# Patient Record
Sex: Female | Born: 1960 | Race: White | Hispanic: No | Marital: Married | Smoking: Never smoker
Health system: Southern US, Community
[De-identification: ages and names within clinical notes are randomized; demographics above are authoritative.]

## PROBLEM LIST (undated history)

## (undated) DIAGNOSIS — L309 Dermatitis, unspecified: Secondary | ICD-10-CM

## (undated) DIAGNOSIS — J069 Acute upper respiratory infection, unspecified: Secondary | ICD-10-CM

## (undated) DIAGNOSIS — K219 Gastro-esophageal reflux disease without esophagitis: Secondary | ICD-10-CM

## (undated) HISTORY — PX: CHOLECYSTECTOMY: SHX55

## (undated) HISTORY — DX: Acute upper respiratory infection, unspecified: J06.9

## (undated) HISTORY — PX: TYMPANOSTOMY TUBE PLACEMENT: SHX32

## (undated) HISTORY — DX: Dermatitis, unspecified: L30.9

## (undated) HISTORY — PX: ABDOMINAL HYSTERECTOMY: SHX81

---

## 1999-01-12 ENCOUNTER — Other Ambulatory Visit: Admission: RE | Admit: 1999-01-12 | Discharge: 1999-01-12 | Payer: Self-pay | Admitting: Obstetrics and Gynecology

## 1999-10-16 ENCOUNTER — Ambulatory Visit (HOSPITAL_COMMUNITY): Admission: RE | Admit: 1999-10-16 | Discharge: 1999-10-16 | Payer: Self-pay | Admitting: Gastroenterology

## 2002-01-31 ENCOUNTER — Other Ambulatory Visit: Admission: RE | Admit: 2002-01-31 | Discharge: 2002-01-31 | Payer: Self-pay | Admitting: Obstetrics and Gynecology

## 2004-02-03 ENCOUNTER — Encounter: Admission: RE | Admit: 2004-02-03 | Discharge: 2004-02-03 | Payer: Self-pay | Admitting: Internal Medicine

## 2004-03-26 ENCOUNTER — Other Ambulatory Visit: Admission: RE | Admit: 2004-03-26 | Discharge: 2004-03-26 | Payer: Self-pay | Admitting: Obstetrics and Gynecology

## 2005-04-22 ENCOUNTER — Other Ambulatory Visit: Admission: RE | Admit: 2005-04-22 | Discharge: 2005-04-22 | Payer: Self-pay | Admitting: Obstetrics and Gynecology

## 2006-02-02 ENCOUNTER — Encounter: Admission: RE | Admit: 2006-02-02 | Discharge: 2006-02-02 | Payer: Self-pay | Admitting: Internal Medicine

## 2006-02-04 ENCOUNTER — Inpatient Hospital Stay (HOSPITAL_COMMUNITY): Admission: EM | Admit: 2006-02-04 | Discharge: 2006-02-05 | Payer: Self-pay | Admitting: Emergency Medicine

## 2007-12-25 ENCOUNTER — Ambulatory Visit: Payer: Self-pay | Admitting: Professional

## 2008-01-01 ENCOUNTER — Ambulatory Visit: Payer: Self-pay | Admitting: Professional

## 2008-07-25 ENCOUNTER — Ambulatory Visit: Payer: Self-pay | Admitting: Professional

## 2008-09-06 ENCOUNTER — Encounter: Admission: RE | Admit: 2008-09-06 | Discharge: 2008-09-06 | Payer: Self-pay | Admitting: Sports Medicine

## 2008-09-12 ENCOUNTER — Encounter: Admission: RE | Admit: 2008-09-12 | Discharge: 2008-09-12 | Payer: Self-pay | Admitting: Sports Medicine

## 2008-10-03 ENCOUNTER — Encounter: Admission: RE | Admit: 2008-10-03 | Discharge: 2008-10-03 | Payer: Self-pay | Admitting: Sports Medicine

## 2008-11-07 ENCOUNTER — Encounter: Admission: RE | Admit: 2008-11-07 | Discharge: 2008-11-07 | Payer: Self-pay | Admitting: Sports Medicine

## 2010-06-12 NOTE — Discharge Summary (Signed)
NAMECOURTENY, EGLER                  ACCOUNT NO.:  [id number]   MEDICAL RECORD NO.:  [id number]          PATIENT TYPE:  INP   LOCATION:  6732                         FACILITY:  [facility]   PHYSICIAN:  Theressa Millard, M.D.    DATE OF BIRTH:  May 10, 1960   DATE OF ADMISSION:  02/03/2006  DATE OF DISCHARGE:  02/05/2006                               DISCHARGE SUMMARY   ADMITTING DIAGNOSIS:  Urticaria and facial swelling.   DISCHARGE DIAGNOSES:  1. Urticaria and angioedema, ? drug reaction versus contrast reaction      versus other.  2. Chest pain, possibly related to angioedema.  3. Adult attention deficit disorder.  4. Allergic rhinitis.   The patient is a 50 year old white female who a couple of days prior to  admission developed problems with itching.  On the morning before  admission, she developed chest pain and was seen in the office by Dr.  Olena Leatherwood.  She had an EKG and chest x-ray and ultimately a CT scan of the  chest which revealed no significant findings.  The pain was treated with  Demerol in the office.  Later that night, she developed problems with  itching of the palms and a rash that seemed to be consistent with hives.  She took Benadryl without much relief.  She came to the emergency room  at approximately 4 a.m. when she developed diffuse facial and scalp  swelling without redness or significant itching.   HOSPITAL COURSE:  The patient was admitted and she had a striking  swelling of the upper face, forehead, and scalp, as well as occiput.  This was consistent with more of an angioedema look.  There were no  urticarial lesions on the face or the scalp.  She did have urticarial  lesions on the arms and red macular lesions on the palms bilaterally.  These were itching.  She was seen in consultation by Dr. Venancio Poisson  from dermatology.  She was thought to probably be having a reaction to  something that she was exposed to.  Dr. Nicholas Lose raised the possibility  that this  possibly could be contrast induced.  The difficulty is that  she was having some mild symptoms even before she got contrast, so the  etiology of each of her symptoms is not very clear.  In any event, she  improved over the subsequent 24 hours with decreasing facial swelling,  although it was still present, and she was continued on Atarax during  this time.  She was not treated with systemic steroids after initial  shot.  She was given Toradol for the chest discomfort.  She was  discharged in improved condition on Toradol one q. 6h. p.r.n. pain,  Atarax 50 mg one q. 6h. p.r.n. itching, and Adderall 20 mg q. a.m. and  10 mg q. p.m.  She was advised that she may have a contrast reaction and  if she needs contrast material in the future she should be pretreated.   ACTIVITY:  As tolerated.   FOLLOWUP:  As needed.      Theressa Millard, M.D.  Electronically Signed    JO/MEDQ  D:  02/07/2006  T:  02/07/2006  Job:  [id number]

## 2010-06-12 NOTE — H&P (Signed)
Karina Alvarez, Karina Alvarez NO.:  [id number]   MEDICAL RECORD NO.:  [id number]          PATIENT TYPE:  EMS   LOCATION:  MAJO                         FACILITY:  [facility]   PHYSICIAN:  Theressa Millard, M.D.    DATE OF BIRTH:  Apr 19, 1960   DATE OF ADMISSION:  02/03/2006  DATE OF DISCHARGE:                              HISTORY & PHYSICAL   PRIMARY CARE PHYSICIAN:  Dr. Benjaman Kindler.   CHIEF COMPLAINT:  Swelling and itching.   HISTORY OF PRESENT ILLNESS:  The patient is a 50 year old white female  with past medical history of GERD, who recently for a right elbow  tenosynovitis was started on prednisone in the past week.  She said she  had been tolerating this, but then started noticing some pruritus  systemically starting about 3-4 days ago.  Those symptoms persisted and  then all of a sudden she started complaining of noticing some chest  pain.  She was evaluated by Dr. Newell Coral partner, Dr. Olena Leatherwood, in the  office, who had lab work and an EKG done of which were of no  consequence.  The patient was sent home.  Reportedly she got a dose of  pain medication believed to be Demerol in the office; she also got a  prescription of Demerol and went home.  She was going to fill this  prescription when she found an old bottle of Percocet pills that were  well over 6 months old, if not longer, and a neighbor had advised her  that she could take that for pain instead.  Please note that prior to  this, the patient had not taken any narcotics when her pruritus first  began.  She initially took 2 Percocet and then a few hours later had  taken 1 more.  She said that 1-2 hours after that, she noted that her  itching became markedly worse.  A rash, which initially had been on her  hands, started appearing as blotchy on her face and around her mouth and  she started noticing swelling of her head.  She was advised then to come  into the emergency room, which she did.  In the emergency room, she  was  noted to be slightly borderline hypotensive with a blood pressure of  96/66.  The patient was given 40 mg of Pepcid, 50 mg of hydroxyzine and  10 mg of p.o. dexamethasone.  She said initially the Pepcid and Atarax  seemed to work and she seemed to feel as if her symptoms were slightly  improving.  She felt as if, however, the itching was slightly increased  after she received the oral steroids.  Currently, she is complaining of  severe itching.  She denies any other complaints such as headache,  vision changes, dysphagia, chest pain, throat closing, palpitations,  shortness of breath, wheezing, coughing.  No abdominal pain.  No  hematuria or dysuria, constipation or diarrhea.  She does complain of  some mild swelling in her lower extremities and hands.   REVIEW OF SYSTEMS:  Otherwise negative.   PAST MEDICAL HISTORY:  1.  GERD.  2. Recent tenosynovitis.   MEDICATIONS:  She takes an allergy pill.  She was recently taking  prednisone acutely.   ALLERGIES:  She is allergic to CODEINE.  She notes no food allergies.   SOCIAL HISTORY:  She denies any tobacco, excessive alcohol or drug use.   FAMILY HISTORY:  Noncontributory.   PHYSICAL EXAMINATION:  VITALS ON ADMISSION:  Temperature 97.1, heart  rate 88, blood pressure 96/66, respirations 22, O2 saturation 99% on  room air.  GENERAL:  The patient is alert and oriented x3, in no apparent distress.  HEENT:  She is noted to have some marked cranial swelling, which is  impressive.  Her mucous membranes are slightly dry.  SKIN:  She has some trace blotchiness around her lips and sides and back  of her neck as well as trace blotches on her trunk and hands.  NECK:  She has no carotid bruits.  HEART:  Regular rate and rhythm.  S1 and S2.  LUNGS:  Completely clear to auscultation bilaterally.  ABDOMEN:  Soft, nontender and non-distended.  Positive bowel sounds.  EXTREMITIES:  No clubbing, cyanosis or pitting edema.  Pulses 2+.    LABORATORY WORK:  I have ordered a CBC with differential.   ASSESSMENT AND PLAN:  Systemic allergic reaction.  It certainly seems  consistent that this exacerbation was worsened after taking these old  Percocet pills, as evidenced by the cranial swelling, increased pruritus  and rash, which may be from the narcotic itself, or possibly from a  degraded product from old medications.  However, note that the pruritus  initially began on January 31, 2006 and the only new medicine recently  has been prednisone, which I find is extremely unusual to have a  allergic reaction to prednisone.  This may be a food allergy, although  the patient does not note any new types of food or exposure.  We will  plan to treat conservatively with intravenous fluids, as-needed Benadryl  and 24-hour observation.  If patient has significant improvement, would  not put her on a prednisone taper, given this possibility, although  again, albeit quite unusual.      Hollice Espy, M.D.   Electronically Signed     ______________________________  Theressa Millard, M.D.    SKK/MEDQ  D:  02/03/2006  T:  02/03/2006  Job:  [id number]

## 2010-06-12 NOTE — Procedures (Signed)
[facility]  Patient:    Karina Alvarez, Karina Alvarez                           MRN: [id number] Proc. Date: 10/16/99 Attending:  Verlin Grills, M.D. CC:         Juluis Mire, M.D.   Procedure Report  PROCEDURE:  Esophagogastroduodenoscopy and colonoscopy.  ENDOSCOPIST:  Verlin Grills, M.D.  REFERRING PHYSICIAN:  Juluis Mire, M.D.  INDICATIONS FOR PROCEDURE:  The patient is a 50 year old female.  Her brother was diagnosed with rectal cancer approximately two weeks ago.  Her brother is only 42 years old.  The patient also has chronic gastroesophageal reflux.  She request an upper endoscopy to rule out Barretts esophagus.  I discussed with the patient the complications associated with colonoscopy and esophagogastroduodenoscopy including intestinal bleeding and intestinal perforation.  The patient has signed the operative permit.  MEDICATION ALLERGIES:  CODEINE.  PAST MEDICAL HISTORY:  Allergic rhinitis, gastroesophageal reflux disease, remote cholecystectomy, remote vaginal hysterectomy, herpes labialis.  HABITS:  The patient does not smoke cigarettes and she consumes alcohol in moderation.  FAMILY HISTORY:  Brother diagnosed with rectal cancer at age 72.  Sister diagnosed with colon polyps which were removed colonoscopically.  PREMEDICATION:  Demerol 100 mg and Versed 7 mg.  ENDOSCOPE:  Olympus gastroscope and pediatric colonoscope.  DESCRIPTION OF PROCEDURE:  - Esophagogastroduodenoscopy - after obtaining informed consent, the patient was placed in the left lateral decubitus position.  I administered intravenous Demerol and intravenous Versed to achieve conscious sedation for the procedure.  The patients blood pressure and oxygen saturation and cardiac rhythm were monitored throughout the procedure and documented in the medical record.  The Olympus gastroscope was passed through the posterior hypopharynx into the proximal esophagus  without difficulty.  The hypopharynx, larynx, and vocal cords appeared normal.  Esophagoscopy:  The proximal, mid, and lower segments of the esophagus appeared normal.  Endoscopically, there is no evidence of mucosal esophageal scarring, Barretts esophagus, erosive esophagitis, or esophageal ulcers.  Gastroscopy:  Retroflexed view of the gastric cardia and fundus was normal. There is no evidence of a hiatal hernia.  Endoscopic appearance of the gastric body, antrum, and pylorus was normal.  Duodenoscopy:  The duodenal bulb and descending duodenum appeared normal.  ASSESSMENT:  Normal esophagogastroduodenoscopy.  #2 - PROCTOCOLONOSCOPY TO THE CECUM.  Anal inspection was normal.  Digital rectal exam was normal.  The Olympus pediatric video colonoscope was introduced into the rectum under direct vision and advanced to the cecum as identified by a normal-appearing ileocecal valve.  Colonic preparation for the exam today was excellent.  Rectum normal.  Sigmoid colon and descending colon normal.  Splenic flexure normal.  Transverse colon normal.  Hepatic flexure normal.  Ascending colon normal.  Cecum and ileocecal valve normal.  ASSESSMENT:  Normal proctocolonoscopy to the cecum.  RECOMMENDATIONS:  Repeat colonoscopy in five years. DD:  10/16/99 TD:  10/18/99 Job: [id number]

## 2010-07-08 ENCOUNTER — Other Ambulatory Visit: Payer: Self-pay | Admitting: Obstetrics and Gynecology

## 2010-07-08 DIAGNOSIS — R928 Other abnormal and inconclusive findings on diagnostic imaging of breast: Secondary | ICD-10-CM

## 2010-07-09 ENCOUNTER — Ambulatory Visit
Admission: RE | Admit: 2010-07-09 | Discharge: 2010-07-09 | Disposition: A | Discharge status: Home / Self Care | Payer: BC Managed Care – PPO | Source: Ambulatory Visit | Attending: Obstetrics and Gynecology | Admitting: Obstetrics and Gynecology

## 2010-07-09 DIAGNOSIS — R928 Other abnormal and inconclusive findings on diagnostic imaging of breast: Secondary | ICD-10-CM

## 2011-04-13 ENCOUNTER — Other Ambulatory Visit: Payer: Self-pay | Admitting: Obstetrics and Gynecology

## 2011-04-13 DIAGNOSIS — Z1231 Encounter for screening mammogram for malignant neoplasm of breast: Secondary | ICD-10-CM

## 2011-05-18 ENCOUNTER — Other Ambulatory Visit: Payer: Self-pay | Admitting: *Deleted

## 2011-05-18 ENCOUNTER — Ambulatory Visit
Admission: RE | Admit: 2011-05-18 | Discharge: 2011-05-18 | Disposition: A | Discharge status: Home / Self Care | Payer: BC Managed Care – PPO | Source: Ambulatory Visit | Attending: *Deleted | Admitting: *Deleted

## 2011-05-18 DIAGNOSIS — R221 Localized swelling, mass and lump, neck: Secondary | ICD-10-CM

## 2011-07-12 ENCOUNTER — Ambulatory Visit
Admission: RE | Admit: 2011-07-12 | Discharge: 2011-07-12 | Disposition: A | Discharge status: Home / Self Care | Payer: BC Managed Care – PPO | Source: Ambulatory Visit | Attending: Obstetrics and Gynecology | Admitting: Obstetrics and Gynecology

## 2011-07-12 DIAGNOSIS — Z1231 Encounter for screening mammogram for malignant neoplasm of breast: Secondary | ICD-10-CM

## 2011-09-22 ENCOUNTER — Other Ambulatory Visit: Payer: Self-pay | Admitting: Otolaryngology

## 2011-09-22 DIAGNOSIS — R42 Dizziness and giddiness: Secondary | ICD-10-CM

## 2011-09-23 ENCOUNTER — Ambulatory Visit
Admission: RE | Admit: 2011-09-23 | Discharge: 2011-09-23 | Disposition: A | Discharge status: Home / Self Care | Payer: BC Managed Care – PPO | Source: Ambulatory Visit | Attending: Otolaryngology | Admitting: Otolaryngology

## 2011-09-23 DIAGNOSIS — R42 Dizziness and giddiness: Secondary | ICD-10-CM

## 2011-10-26 ENCOUNTER — Other Ambulatory Visit: Payer: Self-pay | Admitting: Otolaryngology

## 2011-10-26 DIAGNOSIS — R42 Dizziness and giddiness: Secondary | ICD-10-CM

## 2011-11-01 ENCOUNTER — Ambulatory Visit
Admission: RE | Admit: 2011-11-01 | Discharge: 2011-11-01 | Disposition: A | Discharge status: Home / Self Care | Payer: BC Managed Care – PPO | Source: Ambulatory Visit | Attending: Otolaryngology | Admitting: Otolaryngology

## 2011-11-01 DIAGNOSIS — R42 Dizziness and giddiness: Secondary | ICD-10-CM

## 2011-11-01 MED ORDER — GADOBENATE DIMEGLUMINE 529 MG/ML IV SOLN
15.0000 mL | Freq: Once | INTRAVENOUS | Status: AC | PRN
  Administered 2011-11-01: 15 mL via INTRAVENOUS

## 2012-07-18 ENCOUNTER — Other Ambulatory Visit: Payer: Self-pay

## 2012-07-18 DIAGNOSIS — Z1231 Encounter for screening mammogram for malignant neoplasm of breast: Secondary | ICD-10-CM

## 2012-08-07 ENCOUNTER — Ambulatory Visit
Admission: RE | Admit: 2012-08-07 | Discharge: 2012-08-07 | Disposition: A | Discharge status: Home / Self Care | Payer: BC Managed Care – PPO | Source: Ambulatory Visit

## 2012-08-07 DIAGNOSIS — Z1231 Encounter for screening mammogram for malignant neoplasm of breast: Secondary | ICD-10-CM

## 2012-10-13 ENCOUNTER — Other Ambulatory Visit: Payer: Self-pay | Admitting: Nurse Practitioner

## 2012-10-13 DIAGNOSIS — R079 Chest pain, unspecified: Secondary | ICD-10-CM

## 2012-10-13 DIAGNOSIS — G8918 Other acute postprocedural pain: Secondary | ICD-10-CM

## 2012-10-16 ENCOUNTER — Ambulatory Visit
Admission: RE | Admit: 2012-10-16 | Discharge: 2012-10-16 | Disposition: A | Discharge status: Home / Self Care | Payer: 59 | Source: Ambulatory Visit | Attending: Nurse Practitioner | Admitting: Nurse Practitioner

## 2012-10-16 DIAGNOSIS — G8918 Other acute postprocedural pain: Secondary | ICD-10-CM

## 2012-10-16 DIAGNOSIS — R079 Chest pain, unspecified: Secondary | ICD-10-CM

## 2013-07-24 ENCOUNTER — Other Ambulatory Visit: Payer: Self-pay

## 2013-07-24 DIAGNOSIS — Z1231 Encounter for screening mammogram for malignant neoplasm of breast: Secondary | ICD-10-CM

## 2013-08-07 ENCOUNTER — Ambulatory Visit: Payer: BC Managed Care – PPO

## 2013-08-08 ENCOUNTER — Ambulatory Visit
Admission: RE | Admit: 2013-08-08 | Discharge: 2013-08-08 | Disposition: A | Discharge status: Home / Self Care | Payer: 59 | Source: Ambulatory Visit

## 2013-08-08 DIAGNOSIS — Z1231 Encounter for screening mammogram for malignant neoplasm of breast: Secondary | ICD-10-CM

## 2013-11-06 ENCOUNTER — Other Ambulatory Visit: Payer: Self-pay | Admitting: Gastroenterology

## 2013-11-06 ENCOUNTER — Ambulatory Visit
Admission: RE | Admit: 2013-11-06 | Discharge: 2013-11-06 | Disposition: A | Discharge status: Home / Self Care | Payer: 59 | Source: Ambulatory Visit | Attending: Gastroenterology | Admitting: Gastroenterology

## 2013-11-06 DIAGNOSIS — K5900 Constipation, unspecified: Secondary | ICD-10-CM

## 2014-03-28 ENCOUNTER — Telehealth: Payer: Self-pay | Admitting: *Deleted

## 2014-03-28 NOTE — Telephone Encounter (Addendum)
Pt request an antiinflammatory for the plantar fasciitis.  I informed pt, our office last saw her prior to 2015 and we would need to evaluate her prior to ordering any medications.

## 2014-07-15 ENCOUNTER — Other Ambulatory Visit: Payer: Self-pay

## 2014-07-15 DIAGNOSIS — Z1231 Encounter for screening mammogram for malignant neoplasm of breast: Secondary | ICD-10-CM

## 2014-08-12 ENCOUNTER — Ambulatory Visit
Admission: RE | Admit: 2014-08-12 | Discharge: 2014-08-12 | Disposition: A | Discharge status: Home / Self Care | Payer: PRIVATE HEALTH INSURANCE | Source: Ambulatory Visit

## 2014-08-12 DIAGNOSIS — Z1231 Encounter for screening mammogram for malignant neoplasm of breast: Secondary | ICD-10-CM

## 2015-08-07 ENCOUNTER — Other Ambulatory Visit: Payer: Self-pay | Admitting: Obstetrics and Gynecology

## 2015-08-07 DIAGNOSIS — Z1231 Encounter for screening mammogram for malignant neoplasm of breast: Secondary | ICD-10-CM

## 2015-08-18 ENCOUNTER — Ambulatory Visit
Admission: RE | Admit: 2015-08-18 | Discharge: 2015-08-18 | Disposition: A | Discharge status: Home / Self Care | Payer: PRIVATE HEALTH INSURANCE | Source: Ambulatory Visit | Attending: Obstetrics and Gynecology | Admitting: Obstetrics and Gynecology

## 2015-08-18 DIAGNOSIS — Z1231 Encounter for screening mammogram for malignant neoplasm of breast: Secondary | ICD-10-CM

## 2015-10-08 ENCOUNTER — Other Ambulatory Visit: Payer: Self-pay | Admitting: Obstetrics and Gynecology

## 2015-10-08 DIAGNOSIS — R7989 Other specified abnormal findings of blood chemistry: Secondary | ICD-10-CM

## 2015-10-08 DIAGNOSIS — R945 Abnormal results of liver function studies: Principal | ICD-10-CM

## 2015-10-10 ENCOUNTER — Ambulatory Visit
Admission: RE | Admit: 2015-10-10 | Discharge: 2015-10-10 | Disposition: A | Discharge status: Home / Self Care | Payer: No Typology Code available for payment source | Source: Ambulatory Visit | Attending: Obstetrics and Gynecology | Admitting: Obstetrics and Gynecology

## 2015-10-10 DIAGNOSIS — R7989 Other specified abnormal findings of blood chemistry: Secondary | ICD-10-CM

## 2015-10-10 DIAGNOSIS — R945 Abnormal results of liver function studies: Principal | ICD-10-CM

## 2015-10-15 ENCOUNTER — Other Ambulatory Visit: Payer: PRIVATE HEALTH INSURANCE

## 2015-11-20 ENCOUNTER — Encounter (HOSPITAL_COMMUNITY): Payer: Self-pay | Admitting: *Deleted

## 2015-11-20 ENCOUNTER — Emergency Department (HOSPITAL_COMMUNITY)
Admission: EM | Admit: 2015-11-20 | Discharge: 2015-11-21 | Disposition: A | Discharge status: Home / Self Care | Payer: No Typology Code available for payment source | Attending: Emergency Medicine | Admitting: Emergency Medicine

## 2015-11-20 DIAGNOSIS — R0989 Other specified symptoms and signs involving the circulatory and respiratory systems: Secondary | ICD-10-CM | POA: Insufficient documentation

## 2015-11-20 HISTORY — DX: Gastro-esophageal reflux disease without esophagitis: K21.9

## 2015-11-20 MED ORDER — GI COCKTAIL ~~LOC~~
30.0000 mL | Freq: Once | ORAL | Status: AC
  Administered 2015-11-20: 30 mL via ORAL
  Filled 2015-11-20: qty 30

## 2015-11-20 NOTE — ED Triage Notes (Signed)
Pt was eating fish when she got a small fishbone stuck in her throat.  Pt attempted to help it down by drinking water as well as continuing to eat.  Pt continues to feel this bone in her throat.  Pt denies any sob.  Pt was successfully able to eat and drink after feeling like the bone was stuck in her throat.  Pt reports that she is anxious.

## 2015-11-20 NOTE — ED Provider Notes (Signed)
[facility]-EMERGENCY DEPT Provider Note   CSN: [id number] Arrival date & time: 11/20/15  2143     History   Chief Complaint Chief Complaint  Patient presents with  . Foreign Body    fishbone stuck in throat    HPI BLAYRE PAPANIA is a 55 y.o. female.  KAIYLA STAHLY is a 55 y.o. Female who presents to the ED complaining of foreign body sensation in her throat. She reports she was eating fish tonight and she thinks with the first bite she felt a fishbone stuck in her throat. This occurred about 3 hours ago.  She reports she continued to eat her meal without difficulty. She's had no vomiting or nausea. She still complains of a foreign body sensation in her throat. She is unable to localize exactly where this sensation is. She reports she has had dilations in the past but she is not sure of the name of the gastroenterologist to do this. She takes medicines for acid reflux. She denies other symptoms. She denies fevers, nausea, vomiting, abdominal pain, or hematemesis.   The history is provided by the patient. No language interpreter was used.  Foreign Body  Associated symptoms include sore throat. Pertinent negatives include no fever, no congestion, no drooling, no trouble swallowing, no cough, no chest pain, no vomiting and no abdominal pain.    Past Medical History:  Diagnosis Date  . GERD (gastroesophageal reflux disease)     There are no active problems to display for this patient.   Past Surgical History:  Procedure Laterality Date  . ABDOMINAL HYSTERECTOMY    . CHOLECYSTECTOMY      OB History    No data available       Home Medications    Prior to Admission medications   Medication Sig Start Date End Date Taking? Authorizing Provider  DM-Phenylephrine-Acetaminophen (VICKS DAYQUIL COLD & FLU) 10-5-325 MG CAPS Take 2 capsules by mouth 2 (two) times daily as needed (COLD).   Yes Historical Provider, MD  guaiFENesin (MUCINEX) 600 MG 12 hr tablet Take 600 mg by mouth 2 (two)  times daily as needed for cough or to loosen phlegm.   Yes Historical Provider, MD  omeprazole (PRILOSEC OTC) 20 MG tablet Take 20 mg by mouth daily.   Yes Historical Provider, MD    Family History No family history on file.  Social History Social History  Substance Use Topics  . Smoking status: Never Smoker  . Smokeless tobacco: Never Used  . Alcohol use Yes     Allergies   Iohexol and Codeine   Review of Systems Review of Systems  Constitutional: Negative for fever.  HENT: Positive for sore throat. Negative for congestion, drooling, mouth sores and trouble swallowing.   Eyes: Negative for visual disturbance.  Respiratory: Negative for cough and shortness of breath.   Cardiovascular: Negative for chest pain.  Gastrointestinal: Negative for abdominal pain, blood in stool, diarrhea, nausea and vomiting.  Musculoskeletal: Negative for neck pain.  Skin: Negative for rash.  Neurological: Negative for headaches.     Physical Exam Updated Vital Signs BP 129/59 (BP Location: Left Arm)   Pulse 70   Temp 98.1 F (36.7 C) (Oral)   Resp 18   Ht 5\' 2"  (1.575 m)   Wt 85.3 kg   SpO2 100%   BMI 34.41 kg/m   Physical Exam  Constitutional: She appears well-developed and well-nourished. No distress.  Nontoxic appearing.  HENT:  Head: Normocephalic and atraumatic.  Right Ear: External ear  normal.  Left Ear: External ear normal.  Mouth/Throat: Oropharynx is clear and moist.  Throat is clear. Uvula is midline without edema. No drooling. No trismus. No stridor. Speech is clear and coherent.  Eyes: Conjunctivae are normal. Pupils are equal, round, and reactive to light. Right eye exhibits no discharge. Left eye exhibits no discharge.  Neck: Normal range of motion. Neck supple. No JVD present. No tracheal deviation present.  Cardiovascular: Normal rate, regular rhythm, normal heart sounds and intact distal pulses.  Exam reveals no gallop and no friction rub.   No murmur  heard. Pulmonary/Chest: Effort normal and breath sounds normal. No stridor. No respiratory distress. She has no wheezes. She has no rales.  Abdominal: Soft. There is no tenderness.  Musculoskeletal: She exhibits no edema.  Lymphadenopathy:    She has no cervical adenopathy.  Neurological: She is alert. Coordination normal.  Skin: Skin is warm and dry. Capillary refill takes less than 2 seconds. No rash noted. She is not diaphoretic. No erythema. No pallor.  Psychiatric: She has a normal mood and affect. Her behavior is normal.  Nursing note and vitals reviewed.    ED Treatments / Results  Labs (all labs ordered are listed, but only abnormal results are displayed) Labs Reviewed - No data to display  EKG  EKG Interpretation None       Radiology No results found.  Procedures Procedures (including critical care time)  Medications Ordered in ED Medications  gi cocktail (Maalox,Lidocaine,Donnatal) (30 mLs Oral Given 11/20/15 2229)     Initial Impression / Assessment and Plan / ED Course  I have reviewed the triage vital signs and the nursing notes.  Pertinent labs & imaging results that were available during my care of the patient were reviewed by me and considered in my medical decision making (see chart for details).  Clinical Course   This is a 55 y.o. Female who presents to the ED complaining of foreign body sensation in her throat. She reports she was eating fish tonight and she thinks with the first bite she felt a fishbone stuck in her throat. This occurred about 3 hours ago.  She reports she continued to eat her meal without difficulty. She's had no vomiting or nausea. She still complains of a foreign body sensation in her throat. She is unable to localize exactly where this sensation is. On exam the patient is afebrile nontoxic appearing. Speech is clear and coherent. No stridor. Throat is clear. Abdomen is soft and non-tender to palpation.  We'll provide with a GI  cocktail and reevaluate. At reevaluation patient has tolerated GI cocktail without difficulty. No nausea or vomiting. She still remains of a slight body sensation in her throat. I discussed options with her. I do not feel that x-ray would be beneficial for her. Either way she needs to follow-up with her GI doctor tomorrow. She has had no nausea or vomiting. She is tolerating food and liquids without difficulty. We'll have her follow-up with her gastroenterologist Dr. Laural Benes.  I discussed return precautions. I advised the patient to follow-up with their primary care provider this week. I advised the patient to return to the emergency department with new or worsening symptoms or new concerns. The patient verbalized understanding and agreement with plan.     Final Clinical Impressions(s) / ED Diagnoses   Final diagnoses:  Sensation of foreign body in throat    New Prescriptions New Prescriptions   No medications on file     Everlene Farrier, PA-C  11/20/15 2312    Pricilla LovelessScott Goldston, MD 11/22/15 1008

## 2015-11-21 ENCOUNTER — Other Ambulatory Visit: Payer: Self-pay | Admitting: Gastroenterology

## 2015-11-21 ENCOUNTER — Ambulatory Visit
Admission: RE | Admit: 2015-11-21 | Discharge: 2015-11-21 | Disposition: A | Discharge status: Home / Self Care | Payer: No Typology Code available for payment source | Source: Ambulatory Visit | Attending: Gastroenterology | Admitting: Gastroenterology

## 2015-11-21 DIAGNOSIS — R1310 Dysphagia, unspecified: Secondary | ICD-10-CM

## 2015-11-21 DIAGNOSIS — M795 Residual foreign body in soft tissue: Secondary | ICD-10-CM

## 2016-08-16 ENCOUNTER — Other Ambulatory Visit: Payer: Self-pay | Admitting: Obstetrics and Gynecology

## 2016-09-06 ENCOUNTER — Other Ambulatory Visit: Payer: Self-pay | Admitting: Obstetrics and Gynecology

## 2016-09-06 DIAGNOSIS — Z1231 Encounter for screening mammogram for malignant neoplasm of breast: Secondary | ICD-10-CM

## 2016-09-14 ENCOUNTER — Ambulatory Visit
Admission: RE | Admit: 2016-09-14 | Discharge: 2016-09-14 | Disposition: A | Discharge status: Home / Self Care | Payer: No Typology Code available for payment source | Source: Ambulatory Visit | Attending: Obstetrics and Gynecology | Admitting: Obstetrics and Gynecology

## 2016-09-14 DIAGNOSIS — Z1231 Encounter for screening mammogram for malignant neoplasm of breast: Secondary | ICD-10-CM

## 2017-05-05 ENCOUNTER — Emergency Department (HOSPITAL_COMMUNITY)
Admission: EM | Admit: 2017-05-05 | Discharge: 2017-05-05 | Disposition: A | Discharge status: Home / Self Care | Payer: No Typology Code available for payment source | Attending: Emergency Medicine | Admitting: Emergency Medicine

## 2017-05-05 ENCOUNTER — Other Ambulatory Visit: Payer: Self-pay

## 2017-05-05 ENCOUNTER — Emergency Department (HOSPITAL_COMMUNITY): Payer: No Typology Code available for payment source

## 2017-05-05 ENCOUNTER — Encounter (HOSPITAL_COMMUNITY): Payer: Self-pay | Admitting: Emergency Medicine

## 2017-05-05 DIAGNOSIS — Z79899 Other long term (current) drug therapy: Secondary | ICD-10-CM | POA: Diagnosis not present

## 2017-05-05 DIAGNOSIS — R079 Chest pain, unspecified: Secondary | ICD-10-CM | POA: Diagnosis present

## 2017-05-05 DIAGNOSIS — R072 Precordial pain: Secondary | ICD-10-CM | POA: Diagnosis not present

## 2017-05-05 LAB — BASIC METABOLIC PANEL
ANION GAP: 10 (ref 5–15)
BUN: 11 mg/dL (ref 6–20)
CHLORIDE: 106 mmol/L (ref 101–111)
CO2: 25 mmol/L (ref 22–32)
CREATININE: 0.75 mg/dL (ref 0.44–1.00)
Calcium: 9.6 mg/dL (ref 8.9–10.3)
GFR calc non Af Amer: >60 mL/min (ref >60)
Glucose, Bld: 97 mg/dL (ref 65–99)
Potassium: 3.8 mmol/L (ref 3.5–5.1)
SODIUM: 141 mmol/L (ref 135–145)

## 2017-05-05 LAB — I-STAT TROPONIN, ED
TROPONIN I, POC: 0 ng/mL (ref 0.00–0.08)
Troponin i, poc: 0 ng/mL (ref 0.00–0.08)

## 2017-05-05 LAB — HEPATIC FUNCTION PANEL
ALK PHOS: 103 U/L (ref 38–126)
ALT: 60 U/L — ABNORMAL HIGH (ref 14–54)
AST: 41 U/L (ref 15–41)
Albumin: 3.8 g/dL (ref 3.5–5.0)
BILIRUBIN TOTAL: 0.8 mg/dL (ref 0.3–1.2)
Bilirubin, Direct: 0.1 mg/dL (ref 0.1–0.5)
Indirect Bilirubin: 0.7 mg/dL (ref 0.3–0.9)
TOTAL PROTEIN: 7 g/dL (ref 6.5–8.1)

## 2017-05-05 LAB — I-STAT BETA HCG BLOOD, ED (MC, WL, AP ONLY)

## 2017-05-05 LAB — CBC
HCT: 43.1 % (ref 36.0–46.0)
HEMOGLOBIN: 14.1 g/dL (ref 12.0–15.0)
MCH: 30.3 pg (ref 26.0–34.0)
MCHC: 32.7 g/dL (ref 30.0–36.0)
MCV: 92.7 fL (ref 78.0–100.0)
PLATELETS: 249 10*3/uL (ref 150–400)
RBC: 4.65 MIL/uL (ref 3.87–5.11)
RDW: 13.5 % (ref 11.5–15.5)
WBC: 7.5 10*3/uL (ref 4.0–10.5)

## 2017-05-05 LAB — LIPASE, BLOOD: LIPASE: 31 U/L (ref 11–51)

## 2017-05-05 MED ORDER — ONDANSETRON HCL 4 MG/2ML IJ SOLN
4.0000 mg | Freq: Once | INTRAMUSCULAR | Status: AC
  Administered 2017-05-05: 4 mg via INTRAVENOUS
  Filled 2017-05-05: qty 2

## 2017-05-05 MED ORDER — ALUM & MAG HYDROXIDE-SIMETH 200-200-20 MG/5ML PO SUSP
30.0000 mL | Freq: Once | ORAL | Status: AC
  Administered 2017-05-05: 30 mL via ORAL
  Filled 2017-05-05: qty 30

## 2017-05-05 MED ORDER — KETOROLAC TROMETHAMINE 30 MG/ML IJ SOLN
30.0000 mg | Freq: Once | INTRAMUSCULAR | Status: AC
  Administered 2017-05-05: 30 mg via INTRAVENOUS
  Filled 2017-05-05: qty 1

## 2017-05-05 MED ORDER — TRAMADOL HCL 50 MG PO TABS: 50.0000 mg | ORAL_TABLET | Freq: Four times a day (QID) | ORAL | 0 refills | Status: DC | PRN

## 2017-05-05 MED ORDER — IBUPROFEN 600 MG PO TABS: 600.0000 mg | ORAL_TABLET | Freq: Three times a day (TID) | ORAL | 0 refills | Status: DC | PRN

## 2017-05-05 NOTE — ED Notes (Signed)
Pt complaining if increased back pain at this time. Pt VSS- pt reassured that we are attempting to get her into room as soon as possible.

## 2017-05-05 NOTE — ED Provider Notes (Signed)
Karina Alvarez [facility] EMERGENCY DEPARTMENT Provider Note   CSN: [id number] Arrival date & time: 05/05/17  1403     History   Chief Complaint Chief Complaint  Patient presents with  . Chest Pain    HPI Karina Alvarez is a 57 y.o. female.  HPI Patient is a relatively healthy 57 year old female with no known significant past medical history who presents the emergency department with anterior chest pain with some radiation towards her back which is burning in location.  It is been constant for nearly 20 hours at this time.  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  Patient was unable to get in her primary care doctor that she was seen at [facility] walk-in clinic and was referred to the ER for further evaluation for active chest pain.  She reports nausea without vomiting.  No diarrhea.  No fevers or chills.  No shortness of breath.  No history of productive cough.  No history of DVT or pulmonary embolism.  No family history of early cardiac disease.  Patient has never had a stress test or heart catheterization at this time.  She reports ongoing constant discomfort.  No radiation to her arms or neck.  She is never had symptoms like this before.  Her symptoms have been constant in nature for nearly 20 hours at this time.  Denies abdominal pain.  No pain under her arms or legs.   Past Medical History:  Diagnosis Date  . GERD (gastroesophageal reflux disease)     There are no active problems to display for this patient.   Past Surgical History:  Procedure Laterality Date  . ABDOMINAL HYSTERECTOMY    . CHOLECYSTECTOMY       OB History   None      Home Medications    Prior to Admission medications   Medication Sig Start Date End Date Taking? Authorizing Provider  DM-Phenylephrine-Acetaminophen (VICKS DAYQUIL COLD & FLU) 10-5-325 MG CAPS Take 2 capsules by mouth 2 (two) times daily as needed  (COLD).    [provider]  guaiFENesin (MUCINEX) 600 MG 12 hr tablet Take 600 mg by mouth 2 (two) times daily as needed for cough or to loosen phlegm.    [provider]  ibuprofen (ADVIL,MOTRIN) 600 MG tablet Take 1 tablet (600 mg total) by mouth every 8 (eight) hours as needed. 05/05/17   Azalia Bilis, MD  omeprazole (PRILOSEC OTC) 20 MG tablet Take 20 mg by mouth daily.    [provider]  traMADol (ULTRAM) 50 MG tablet Take 1 tablet (50 mg total) by mouth every 6 (six) hours as needed. 05/05/17   Azalia Bilis, MD    Family History No family history on file.  Social History Social History   Tobacco Use  . Smoking status: Never Smoker  . Smokeless tobacco: Never Used  Substance Use Topics  . Alcohol use: Yes  . Drug use: Not on file     Allergies   Iohexol and Codeine   Review of Systems Review of Systems  All other systems reviewed and are negative.    Physical Exam Updated Vital Signs BP 115/79   Pulse 73   Temp 98.3 F (36.8 C) (Oral)   Resp (!) 23   Ht 5\' 2"  (1.575 m)   Wt 83.9 kg (185 lb)   SpO2 96%   BMI 33.84 kg/m   Physical Exam  Constitutional:  She is oriented to person, place, and time. She appears well-developed and well-nourished. No distress.  HENT:  Head: Normocephalic and atraumatic.  Eyes: EOM are normal.  Neck: Normal range of motion.  Cardiovascular: Normal rate, regular rhythm and normal heart sounds.  Pulmonary/Chest: Effort normal and breath sounds normal.  Abdominal: Soft. She exhibits no distension. There is no tenderness.  Musculoskeletal: Normal range of motion.  Bilateral radial pulses.  Bilateral PT and DP pulses.  Neurological: She is alert and oriented to person, place, and time.  Skin: Skin is warm and dry.  Psychiatric: She has a normal mood and affect. Judgment normal.  Nursing note and vitals reviewed.    ED Treatments / Results  Labs (all labs ordered are listed, but only abnormal results  are displayed) Labs Reviewed  HEPATIC FUNCTION PANEL - Abnormal; Notable for the following components:      Result Value   ALT 60 (*)    All other components within normal limits  BASIC METABOLIC PANEL  CBC  LIPASE, BLOOD  I-STAT TROPONIN, ED  I-STAT BETA HCG BLOOD, ED ([facility], [facility], [facility] ONLY)  I-STAT TROPONIN, ED    EKG EKG Interpretation  Date/Time:  Thursday May 05 2017 14:09:35 EDT Ventricular Rate:  69 PR Interval:  146 QRS Duration: 82 QT Interval:  406 QTC Calculation: 435 R Axis:   82 Text Interpretation:  Normal sinus rhythm Normal ECG No significant change was found Confirmed by Azalia Bilisampos, Shanise Balch ([id number]) on 05/05/2017 7:58:41 PM   Radiology Dg Chest 2 View  Result Date: 05/05/2017 CLINICAL DATA:  Chest pain EXAM: CHEST - 2 VIEW COMPARISON:  August 25, 2010 FINDINGS: Lungs are clear. Heart size and pulmonary vascularity are normal. No adenopathy. No pneumothorax. No bone lesions. IMPRESSION: No edema or consolidation. Electronically Signed   By: Bretta BangWilliam  Woodruff III M.D.   On: 05/05/2017 15:15    Procedures Procedures (including critical care time)  Medications Ordered in ED Medications  ondansetron (ZOFRAN) injection 4 mg (4 mg Intravenous Given 05/05/17 1954)  alum & mag hydroxide-simeth (MAALOX/MYLANTA) 200-200-20 MG/5ML suspension 30 mL (30 mLs Oral Given 05/05/17 2027)  ketorolac (TORADOL) 30 MG/ML injection 30 mg (30 mg Intravenous Given 05/05/17 2027)     Initial Impression / Assessment and Plan / ED Course  I have reviewed the triage vital signs and the nursing notes.  Pertinent labs & imaging results that were available during my care of the patient were reviewed by me and considered in my medical decision making (see chart for details).    Atypical chest pain.  EKG without ischemic changes.  Troponin negative x2.  Doubt cardiac dissection as she looks so well and has had constant symptoms with normal vital signs.  No indication for CT imaging at this time.   Doubt DVT and pulmonary embolism.  Suspect likely gastroesophageal reflux disease.  Discharged home with tramadol for the pain.  She does have a small pleuritic component to this and thus it could represent pericarditis versus pleurisy.  Patient and friend understand return to the emergency department for any new or worsening symptoms  Final Clinical Impressions(s) / ED Diagnoses   Final diagnoses:  Precordial chest pain    ED Discharge Orders        Ordered    traMADol (ULTRAM) 50 MG tablet  Every 6 hours PRN     05/05/17 2141    ibuprofen (ADVIL,MOTRIN) 600 MG tablet  Every 8 hours PRN     05/05/17 2141  Azalia Bilis, MD 05/05/17 2340

## 2017-05-05 NOTE — ED Triage Notes (Signed)
Patient complains of chest pain that started last night. Describes pain as feeling like there is a mass inside of her chest, back pain is a burning sensation. No other complaints at this time.

## 2017-05-09 ENCOUNTER — Encounter: Payer: Self-pay | Admitting: Nurse Practitioner

## 2017-05-25 ENCOUNTER — Ambulatory Visit: Payer: No Typology Code available for payment source | Admitting: Nurse Practitioner

## 2017-05-25 ENCOUNTER — Encounter

## 2017-08-10 ENCOUNTER — Other Ambulatory Visit: Payer: Self-pay | Admitting: Obstetrics and Gynecology

## 2017-08-10 DIAGNOSIS — Z1231 Encounter for screening mammogram for malignant neoplasm of breast: Secondary | ICD-10-CM

## 2017-09-16 ENCOUNTER — Ambulatory Visit
Admission: RE | Admit: 2017-09-16 | Discharge: 2017-09-16 | Disposition: A | Discharge status: Home / Self Care | Payer: No Typology Code available for payment source | Source: Ambulatory Visit | Attending: Obstetrics and Gynecology | Admitting: Obstetrics and Gynecology

## 2017-09-16 DIAGNOSIS — Z1231 Encounter for screening mammogram for malignant neoplasm of breast: Secondary | ICD-10-CM

## 2018-03-01 ENCOUNTER — Ambulatory Visit
Admission: RE | Admit: 2018-03-01 | Discharge: 2018-03-01 | Disposition: A | Discharge status: Home / Self Care | Payer: No Typology Code available for payment source | Source: Ambulatory Visit | Attending: Nurse Practitioner | Admitting: Nurse Practitioner

## 2018-03-01 ENCOUNTER — Other Ambulatory Visit: Payer: Self-pay | Admitting: Nurse Practitioner

## 2018-03-01 DIAGNOSIS — R05 Cough: Secondary | ICD-10-CM

## 2018-03-01 DIAGNOSIS — R059 Cough, unspecified: Secondary | ICD-10-CM

## 2018-08-23 ENCOUNTER — Other Ambulatory Visit: Payer: Self-pay | Admitting: Obstetrics and Gynecology

## 2018-08-23 DIAGNOSIS — Z1231 Encounter for screening mammogram for malignant neoplasm of breast: Secondary | ICD-10-CM

## 2018-10-09 ENCOUNTER — Other Ambulatory Visit: Payer: Self-pay

## 2018-10-09 ENCOUNTER — Ambulatory Visit
Admission: RE | Admit: 2018-10-09 | Discharge: 2018-10-09 | Disposition: A | Discharge status: Home / Self Care | Payer: No Typology Code available for payment source | Source: Ambulatory Visit | Attending: Obstetrics and Gynecology | Admitting: Obstetrics and Gynecology

## 2018-10-09 DIAGNOSIS — Z1231 Encounter for screening mammogram for malignant neoplasm of breast: Secondary | ICD-10-CM

## 2018-12-11 ENCOUNTER — Other Ambulatory Visit: Payer: Self-pay | Admitting: Internal Medicine

## 2018-12-11 DIAGNOSIS — R7989 Other specified abnormal findings of blood chemistry: Secondary | ICD-10-CM

## 2018-12-25 ENCOUNTER — Other Ambulatory Visit: Payer: No Typology Code available for payment source

## 2019-01-01 ENCOUNTER — Ambulatory Visit
Admission: RE | Admit: 2019-01-01 | Discharge: 2019-01-01 | Disposition: A | Discharge status: Home / Self Care | Payer: No Typology Code available for payment source | Source: Ambulatory Visit | Attending: Internal Medicine | Admitting: Internal Medicine

## 2019-01-01 DIAGNOSIS — R7989 Other specified abnormal findings of blood chemistry: Secondary | ICD-10-CM

## 2019-01-09 ENCOUNTER — Other Ambulatory Visit: Payer: Self-pay | Admitting: Orthopedic Surgery

## 2019-01-09 ENCOUNTER — Other Ambulatory Visit: Payer: Self-pay | Admitting: Internal Medicine

## 2019-01-09 DIAGNOSIS — R748 Abnormal levels of other serum enzymes: Secondary | ICD-10-CM

## 2019-01-09 DIAGNOSIS — M545 Low back pain, unspecified: Secondary | ICD-10-CM

## 2019-01-23 ENCOUNTER — Ambulatory Visit
Admission: RE | Admit: 2019-01-23 | Discharge: 2019-01-23 | Disposition: A | Discharge status: Home / Self Care | Payer: No Typology Code available for payment source | Source: Ambulatory Visit | Attending: Orthopedic Surgery | Admitting: Orthopedic Surgery

## 2019-01-23 ENCOUNTER — Other Ambulatory Visit: Payer: Self-pay

## 2019-01-23 DIAGNOSIS — M545 Low back pain, unspecified: Secondary | ICD-10-CM

## 2019-02-03 ENCOUNTER — Ambulatory Visit
Admission: RE | Admit: 2019-02-03 | Discharge: 2019-02-03 | Disposition: A | Discharge status: Home / Self Care | Payer: No Typology Code available for payment source | Source: Ambulatory Visit | Attending: Internal Medicine | Admitting: Internal Medicine

## 2019-02-03 ENCOUNTER — Other Ambulatory Visit: Payer: Self-pay

## 2019-02-03 DIAGNOSIS — R748 Abnormal levels of other serum enzymes: Secondary | ICD-10-CM

## 2019-02-03 MED ORDER — GADOBENATE DIMEGLUMINE 529 MG/ML IV SOLN
17.0000 mL | Freq: Once | INTRAVENOUS | Status: AC | PRN
  Administered 2019-02-03: 17 mL via INTRAVENOUS

## 2019-02-05 ENCOUNTER — Other Ambulatory Visit: Payer: No Typology Code available for payment source

## 2019-06-18 ENCOUNTER — Other Ambulatory Visit: Payer: Self-pay

## 2019-06-18 ENCOUNTER — Encounter: Payer: Self-pay | Admitting: Neurology

## 2019-06-18 ENCOUNTER — Ambulatory Visit: Payer: No Typology Code available for payment source | Admitting: Neurology

## 2019-06-18 VITALS — BP 118/69 | HR 73 | Ht 62.0 in | Wt 194.0 lb

## 2019-06-18 DIAGNOSIS — G478 Other sleep disorders: Secondary | ICD-10-CM

## 2019-06-18 DIAGNOSIS — E669 Obesity, unspecified: Secondary | ICD-10-CM

## 2019-06-18 DIAGNOSIS — Z9189 Other specified personal risk factors, not elsewhere classified: Secondary | ICD-10-CM

## 2019-06-18 DIAGNOSIS — G4700 Insomnia, unspecified: Secondary | ICD-10-CM

## 2019-06-18 NOTE — Patient Instructions (Signed)

## 2019-06-18 NOTE — Progress Notes (Signed)
SLEEP MEDICINE CLINIC    Provider:  Melvyn Novas, MD  Primary Care Physician:  Rodrigo Ran, MD [address] [place] [address]     Referring Provider: Rodrigo Ran, Md [address] [place],  [place] [address]          Chief Complaint according to patient   Patient presents with:    . New Patient (Initial Visit)     presents today with difficulty staying asleep. never had a SS completed. has been told she snores wakes up every couple hours each night. takes melatonin to help fall asleep. Doesn't keep her asleep.       HISTORY OF PRESENT ILLNESS:  Karina Alvarez is a 59 year- old Caucasian female patient and seen here on 06/18/2019 for insomnia.   Chief concern according to patient : see above    I have the pleasure of seeing Karina Alvarez today, a right -handed Caucasian female with a insomnia, Snoring. Has sometimes woken up by snoring .   She  has a past medical history of GERD (gastroesophageal reflux disease). 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   Sleep relevant medical history: no Nocturia/ has nightmares , had tubes in both ears, sinusitis,   Family medical /sleep history:  Sister with OSA, hypersomnia, narcolepsy.     Social history:  Patient is working at [employer]- and lives in a household with spouse, children are grown. The patient currently works daytime. Tobacco use : never .  ETOH use ; socially, 2 glasses of wine 5 nights a week. ,  Caffeine intake in form of Coffee( 1 cup in AM ) Soda( dr pepper  1/ week) Tea ( at  5 PM.) or energy drinks. Regular exercise- none    Hobbies :  Wine. Cooking.  Sleep habits are as follows: The patient's dinner time is between 6.30  PM.  The patient goes to bed at 9.30  PM and falls asleep within 30 minutes-and continues to sleep for only 2  Hours at a time , wakes from sounds. Always 3-4 AM. The preferred sleep position is on her sides , with the support of 2  pillows.  Dreams are reportedly infrequent/vivid  6 AM is the usual rise time. The patient wakes up at 6 AM with an alarm.  She reports not feeling refreshed or restored in AM, with symptoms such as some residual fatigue. Naps are taken infrequently. No need to nap.    Review of Systems: Out of a complete 14 system review, the patient complains of only the following symptoms, and all other reviewed systems are negative.:   " I am exhausted when coming home from work. " Fatigue, sleepiness , snoring, fragmented sleep, Insomnia - livelong short sleeper, and feeling not sleepy in daytime, averaging 5.5 hours    How likely are you to doze in the following situations: 0 = not likely, 1 = slight chance, 2 = moderate chance, 3 = high chance   Sitting and Reading? Watching Television? Sitting inactive in a public place (theater or meeting)? As a passenger in a car for an hour without a break? Lying down in the afternoon when circumstances permit? Sitting and talking to someone? Sitting quietly after lunch without alcohol? In a car, while stopped for a few minutes in traffic?   Total = 9/ 24 points   FSS endorsed at 10/ 63 points.   Social History   Socioeconomic History  .  Marital status: Married    Spouse name: Not on file  . Number of children: Not on file  . Years of education: Not on file  . Highest education level: Not on file  Occupational History  . Not on file  Tobacco Use  . Smoking status: Never Smoker  . Smokeless tobacco: Never Used  Substance and Sexual Activity  . Alcohol use: Yes  . Drug use: Not on file  . Sexual activity: Not on file  Other Topics Concern  . Not on file  Social History Narrative  . Not on file   Social Determinants of Health   Financial Resource Strain:   . Difficulty of Paying Living Expenses:   Food Insecurity:   . Worried About Charity fundraiser in the Last Year:   . Arboriculturist in the Last Year:   Transportation Needs:   .  Film/video editor (Medical):   Marland Kitchen Lack of Transportation (Non-Medical):   Physical Activity:   . Days of Exercise per Week:   . Minutes of Exercise per Session:   Stress:   . Feeling of Stress :   Social Connections:   . Frequency of Communication with Friends and Family:   . Frequency of Social Gatherings with Friends and Family:   . Attends Religious Services:   . Active Member of Clubs or Organizations:   . Attends Archivist Meetings:   Marland Kitchen Marital Status:     No family history on file.  Past Medical History:  Diagnosis Date  . GERD (gastroesophageal reflux disease)     Past Surgical History:  Procedure Laterality Date  . ABDOMINAL HYSTERECTOMY    . CHOLECYSTECTOMY       Current Outpatient Medications on File Prior to Visit  Medication Sig Dispense Refill  . ALPRAZolam (XANAX) 0.25 MG tablet Take 0.25 mg by mouth daily as needed.    . Ascorbic Acid (VITAMIN C) 1000 MG tablet Take 1,000 mg by mouth daily.    . Chlorpheniramine Maleate (ALLERGY PO) Take 1 tablet by mouth. allertec    . Cholecalciferol (VITAMIN D3 PO) Take 1 tablet by mouth daily.    . montelukast (SINGULAIR) 10 MG tablet Take 10 mg by mouth daily.    . Multiple Vitamins-Minerals (ZINC PO) Take by mouth.    . pantoprazole (PROTONIX) 40 MG tablet Take 40 mg by mouth daily.     No current facility-administered medications on file prior to visit.    Allergies  Allergen Reactions  . Iohexol Other (See Comments)     Code: SOB, Desc: Pt developed facial swelling and difficulty breathing the several hours after receiving CT contrast and was hospitalized., Onset Date: [id number]   . Codeine Nausea And Vomiting    Physical exam:  Today's Vitals   06/18/19 1527  BP: 118/69  Pulse: 73  Weight: 194 lb (88 kg)  Height: 5\' 2"  (1.575 m)   Body mass index is 35.48 kg/m.   Wt Readings from Last 3 Encounters:  06/18/19 194 lb (88 kg)  05/05/17 185 lb (83.9 kg)  11/20/15 188 lb 2 oz (85.3 kg)       Ht Readings from Last 3 Encounters:  06/18/19 5\' 2"  (1.575 m)  05/05/17 5\' 2"  (1.575 m)  11/20/15 5\' 2"  (1.575 m)      General: The patient is awake, alert and appears not in acute distress. The patient is well groomed. Head: Normocephalic, atraumatic. Neck is supple.  Mallampati  3 plus ,  very small, functional macroglossia  , scalloped tongue. ,  neck circumference:15 inches .  Nasal airflow  patent.  Crowded dentition, small jaw. Dental status:  Cardiovascular:  Regular rate and cardiac rhythm by pulse,  without distended neck veins. Respiratory: Lungs are clear to auscultation.  Skin:  Without evidence of ankle edema, or rash. Trunk: The patient's posture is erect.   Neurologic exam : The patient is awake and alert, oriented to place and time.   Memory subjective described as intact.  Attention span & concentration ability appears normal.  Speech is fluent,  without  dysarthria, dysphonia or aphasia.  Mood and affect are appropriate.   Cranial nerves: no loss of smell or taste reported  Pupils are equal and briskly reactive to light. Funduscopic exam deferred.   Extraocular movements in vertical and horizontal planes were with nystagmus.  There is abnormal eye movements to each side, not with up or downwards gaze.  No Diplopia. Visual fields by finger perimetry are intact. Hearing was intact to soft voice and finger rubbing.   Facial sensation intact to fine touch. Facial motor strength is symmetric and tongue and uvula move midline.  Neck ROM : rotation, tilt and flexion extension were normal for age and shoulder shrug was symmetrical.    Motor exam:  Symmetric bulk, tone and ROM.   Normal tone without cog wheeling, symmetric grip strength .  Sensory:  Fine touch, pinprick and vibration were tested  and  normal.  Proprioception tested in the upper extremities was normal. Coordination: Rapid alternating movements in the fingers/hands were of normal speed.  The  Finger-to-nose maneuver was intact without evidence of ataxia, dysmetria or tremor. Gait and station: Patient could rise unassisted from a seated position, walked without assistive device.  Stance is of normal width/ base.  Toe and heel walk were deferred.  Deep tendon reflexes: in the  upper and lower extremities are symmetric and intact.  Babinski response was deferred.     After spending a total time of  45 minutes face to face and additional time for physical and neurologic examination, review of laboratory studies,  personal review of imaging studies, reports and results of other testing and review of referral information / records as far as provided in visit, I have established the following assessments:  1)  Patient with high anatomical risk factors for OSA, small jaw, large tongue, and BMI  35.4 kg/m2 .  2)  Patient with life long short sleep tendency.  3) Patient had parasomnias on Ambien.  4) Vertigo.    My Plan is to proceed with:  1) HST to rule out apnea, sleep apnea in women is a main cause of insomnia.  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 .    I would like to thank  Rodrigo Ran, Md [address] [place],  [place] [address] for allowing me to meet with and to take care of this pleasant patient.   I plan to follow up either personally or through our NP within 2-3  month.   CC: I will share my notes with  Dr Waynard Edwards, MD .  Electronically signed by: Melvyn Novas, MD 06/18/2019 4:00 PM  [facility] and [facility] Sleep Board certified by The [employer] of Sleep Medicine and Diplomate of the [employer] of Sleep Medicine. Board certified In Neurology through the ABPN, Fellow of the [employer] of Neurology. Medical Director of [employer].

## 2019-06-27 ENCOUNTER — Telehealth: Payer: Self-pay

## 2019-06-27 NOTE — Telephone Encounter (Signed)
LVM for pt to call me back to schedule sleep study  

## 2019-08-15 ENCOUNTER — Ambulatory Visit (INDEPENDENT_AMBULATORY_CARE_PROVIDER_SITE_OTHER): Payer: No Typology Code available for payment source | Admitting: Neurology

## 2019-08-15 DIAGNOSIS — G478 Other sleep disorders: Secondary | ICD-10-CM

## 2019-08-15 DIAGNOSIS — G4733 Obstructive sleep apnea (adult) (pediatric): Secondary | ICD-10-CM

## 2019-08-15 DIAGNOSIS — E669 Obesity, unspecified: Secondary | ICD-10-CM

## 2019-08-15 DIAGNOSIS — G4700 Insomnia, unspecified: Secondary | ICD-10-CM

## 2019-08-15 DIAGNOSIS — Z9189 Other specified personal risk factors, not elsewhere classified: Secondary | ICD-10-CM

## 2019-09-03 ENCOUNTER — Encounter: Payer: Self-pay | Admitting: Neurology

## 2019-09-03 DIAGNOSIS — G4700 Insomnia, unspecified: Secondary | ICD-10-CM | POA: Insufficient documentation

## 2019-09-03 DIAGNOSIS — E669 Obesity, unspecified: Secondary | ICD-10-CM | POA: Insufficient documentation

## 2019-09-03 DIAGNOSIS — G478 Other sleep disorders: Secondary | ICD-10-CM | POA: Insufficient documentation

## 2019-09-03 DIAGNOSIS — G4733 Obstructive sleep apnea (adult) (pediatric): Secondary | ICD-10-CM | POA: Insufficient documentation

## 2019-09-03 DIAGNOSIS — Z9189 Other specified personal risk factors, not elsewhere classified: Secondary | ICD-10-CM | POA: Insufficient documentation

## 2019-09-03 NOTE — Procedures (Signed)
  Patient Information     First Name: Karina Last Name: Keelia Alvarez: [id number]  Birth Date: November 07, 1960 Age: 59  Gender: Female  Referring Provider: Rodrigo Ran, MD BMI: 35.7 (W=194 lb, H=5' 2'')  Neck Circ.:  15 '' Epworth:  9/24   Sleep Study Information    Study Date: 08/15/19 S/H/A Version: [id number] / 4.1.1528 / 77  History:    Karina Alvarez is a right -handed Caucasian female with Insomnia, Snoring. Has sometimes been woken up by her snoring.  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 Sleep relevant medical history: has nightmares, had tubes in both ears, sinusitis, obesity..       Summary & Diagnosis:      Overall mild OSA at an apnea-hypopnea index (AHI) of 8.3/h but in strongly REM sleep dependent form. No sleep related hypoxemia was recorded.     Recommendations:      REM dependent apnea is best responding to CPAP / BiPAP treatment. An autotitration device can be used with heated humidification, and mask of patient's preference.   CPAP settings shall be 5-15 cm water and 1 cm EPR.   Interpreting Physician: Melanee Left            Sleep Summary  Oxygen Saturation Statistics   Start Study Time: End Study Time: Total Recording Time:        10:22:25 PM       6:01:55 AM 7 h, 39 min  Total Sleep Time % REM of Sleep Time:  6 h, 24 min  10.5    Mean: 94 Minimum: 87 Maximum: 99  Mean of Desaturations Nadirs (%):   92  Oxygen Desaturation. %: 4-9 10-20 >20 Total  Events Number Total  17 100.0  0 0.0  0 0.0  17 100.0  Oxygen Saturation: <90 <=88 <85 <80 <70  Duration (minutes): Sleep % 0.0 0.0 0.0 0.0 0.0 0.0 0.0 0.0 0.0 0.0     Respiratory Indices      Total Events REM NREM All Night  pRDI:  61  pAHI:  53 ODI:  17  pAHIc:  0  % CSR: 0.0 28.2 28.2 16.3 0.0 7.3 5.9 1.1 0.0 9.5 8.3 2.7 0.0       Pulse Rate Statistics during Sleep (BPM)      Mean: 77  Minimum: 62 Maximum: 108    Indices are calculated using technically valid sleep time of 6 h, 23 min. Central-Indices are calculated using technically valid sleep time of 6 h, 9 min. pRDI/pAHI are calculated using oxi desaturations ? 3%  Body Position Statistics  Position Supine Prone Right Left Non-Supine  Sleep (min) 27.5 4.0 160.5 192.5 357.0  Sleep % 7.2 1.0 41.7 50.1 92.8  pRDI 6.6 N/A 10.1 8.7 9.8  pAHI 6.6 N/A 10.1 6.2 8.4  ODI 4.4 N/A 3.4 1.9 2.5     Snoring Statistics Snoring Level (dB) >40 >50 >60 >70 >80 >Threshold (45)  Sleep (min) 108.6 11.9 0.4 0.0 0.0 36.3  Sleep % 28.2 3.1 0.1 0.0 0.0 9.4    Mean: 41 dB

## 2019-09-03 NOTE — Addendum Note (Signed)
Addended by: Melvyn Novas on: 09/03/2019 09:03 AM   Modules accepted: Orders

## 2019-09-04 ENCOUNTER — Telehealth: Payer: Self-pay | Admitting: Neurology

## 2019-09-04 NOTE — Telephone Encounter (Signed)
Called the patient and reviewed the sleep study results in detail. Advised what Dr Dohmeier reccomends and at this time the patient declines starting the machine. Offered the pt a follow up visit but she had to get off phone due to being at work and states that she would call back.

## 2019-09-04 NOTE — Telephone Encounter (Signed)
-----   Message from Melvyn Novas, MD sent at 09/03/2019  9:03 AM EDT ----- Summary & Diagnosis:     Overall mild OSA at an apnea-hypopnea index (AHI) of 8.3/h but in  strongly REM sleep dependent form. No sleep related hypoxemia was  recorded.     Recommendations:    REM dependent apnea is best responding to CPAP / BiPAP  treatment. An autotitration device can be used with heated  humidification, and mask of patient's preference.   CPAP settings shall be 5-15 cm water and 1 cm EPR.   Interpreting Physician: Porfirio Mylar Dohmeier,MD

## 2019-10-04 ENCOUNTER — Other Ambulatory Visit: Payer: Self-pay | Admitting: Obstetrics and Gynecology

## 2019-10-04 DIAGNOSIS — Z1231 Encounter for screening mammogram for malignant neoplasm of breast: Secondary | ICD-10-CM

## 2019-10-08 ENCOUNTER — Encounter: Payer: Self-pay | Admitting: Neurology

## 2019-10-19 ENCOUNTER — Other Ambulatory Visit: Payer: Self-pay

## 2019-10-19 ENCOUNTER — Ambulatory Visit
Admission: RE | Admit: 2019-10-19 | Discharge: 2019-10-19 | Disposition: A | Discharge status: Home / Self Care | Payer: No Typology Code available for payment source | Source: Ambulatory Visit | Attending: Obstetrics and Gynecology | Admitting: Obstetrics and Gynecology

## 2019-10-19 DIAGNOSIS — Z1231 Encounter for screening mammogram for malignant neoplasm of breast: Secondary | ICD-10-CM

## 2020-03-30 IMAGING — MR MR ABDOMEN WO/W CM MRCP
13 of 19 series · 31 of 48 positions shown · IV contrast (multihance)
Comparison: 01/01/2019 abdominal sonogram and liver elastography.
06/07/2017 MRI abdomen.

CLINICAL DATA: Elevated liver function tests. History of
cholecystectomy.

EXAM:
MRI ABDOMEN WITHOUT AND WITH CONTRAST (INCLUDING MRCP)
TECHNIQUE: Multiplanar multisequence MR imaging of the abdomen was performed
both before and after the administration of intravenous contrast.
Heavily T2-weighted images of the biliary and pancreatic ducts were
obtained, and three-dimensional MRCP images were rendered by post
processing.
CONTRAST:  17mL MULTIHANCE GADOBENATE DIMEGLUMINE 529 MG/ML IV SOLN

[Series 3: T2 · coronal · 5.0mm · 1.48mm/px · 2 of 30 slices shown (1 of 3)]
[im 1/30]
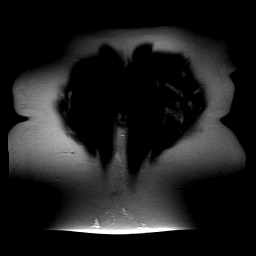
[im 30/30]
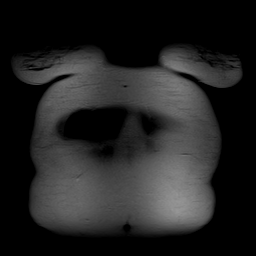

[Series 4: T2 · axial · 6.0mm · 1.48mm/px · z∈[-85,+143]mm · 2 of 34 slices shown (2 of 3)]
[im 1/34]
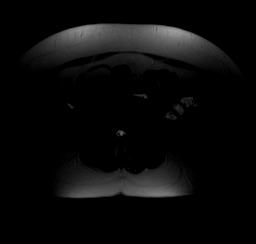
[im 34/34]
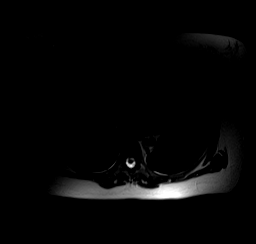

[Series 5: axial in out · axial · 6.0mm · 0.74mm/px · z∈[-85,+143]mm · 4 of 68 slices shown]
[im 1/68]
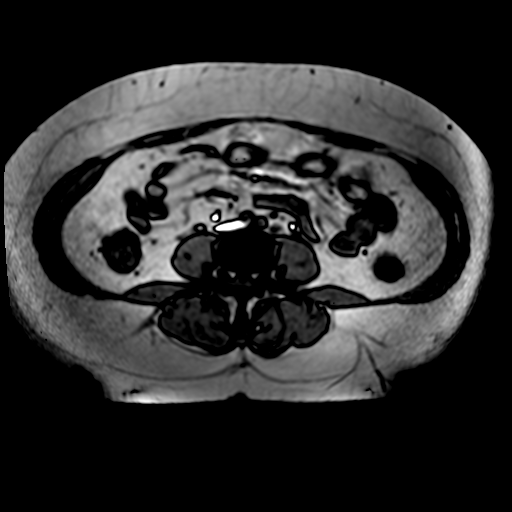
[im 23/68]
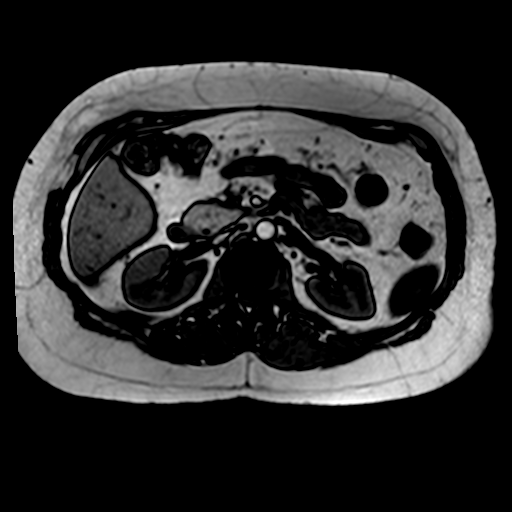
[im 45/68]
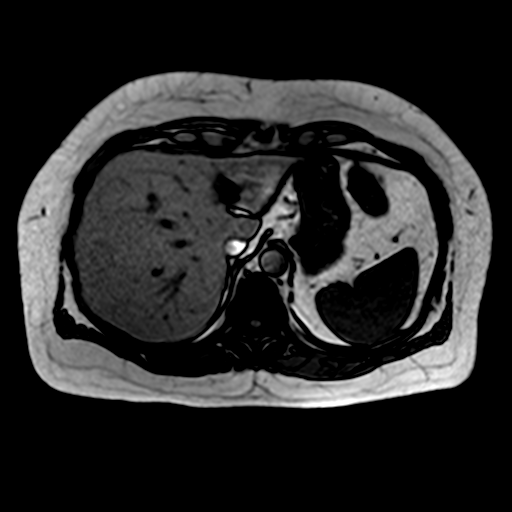
[im 68/68]
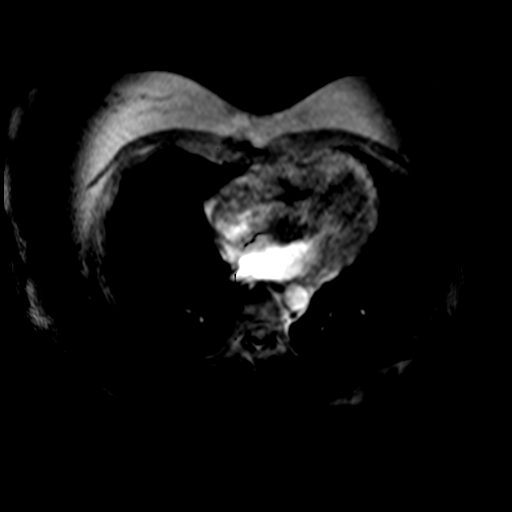

[Series 6: axial tru fisp · axial · 5.0mm · 1.48mm/px · z∈[-72,+135]mm · 2 of 37 slices shown]
[im 1/37]
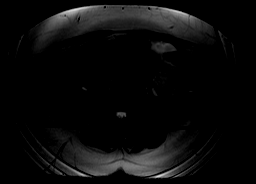
[im 37/37]
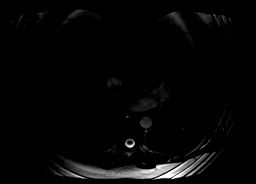

[Series 9: T2 · axial · 6.0mm · 0.74mm/px · 1 of 35 slices shown (3 of 3)]
[im 1/35]
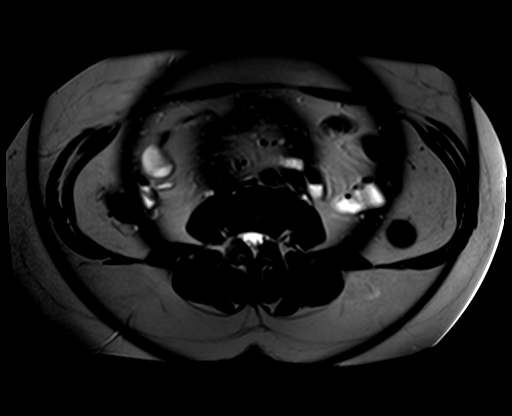

[Series 11: ep2d_diff_b50_500_800_p2 · axial · 6.0mm · 1.98mm/px · z∈[-70,+165]mm · 4 of 105 slices shown]
[im 1/105]
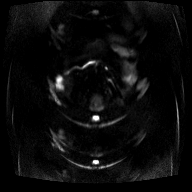
[im 35/105]
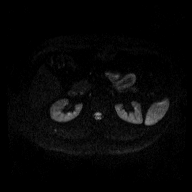
[im 70/105]
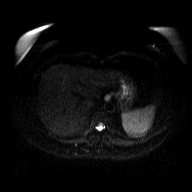
[im 105/105]
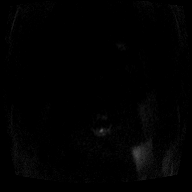

[Series 12: ep2d_diff_b50_500_800_p2_adc · axial · 6.0mm · 1.98mm/px · 1 of 35 slices shown]
[im 1/35]
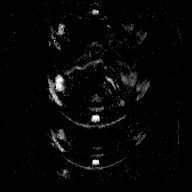

[Series 15: cor haste fs · coronal · 3.0mm · 0.74mm/px · 1 of 19 slices shown]
[im 1/19]
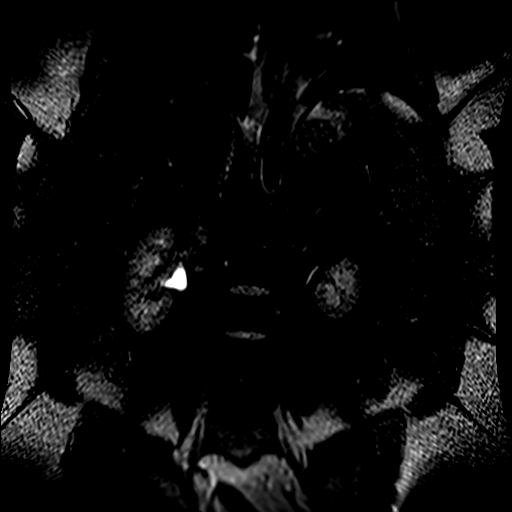

[Series 16: T1 dynamic · axial · non-contrast · 2.5mm · 0.74mm/px · z∈[-77,+140]mm · 3 of 88 slices shown]
[im 1/88]
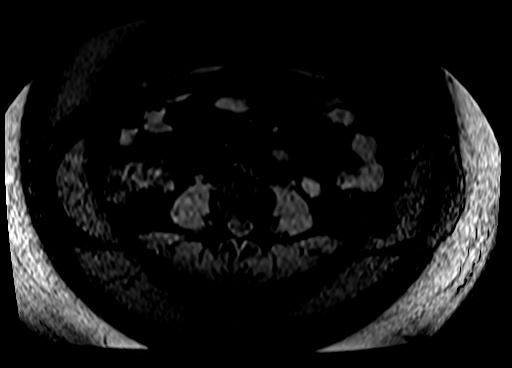
[im 44/88]
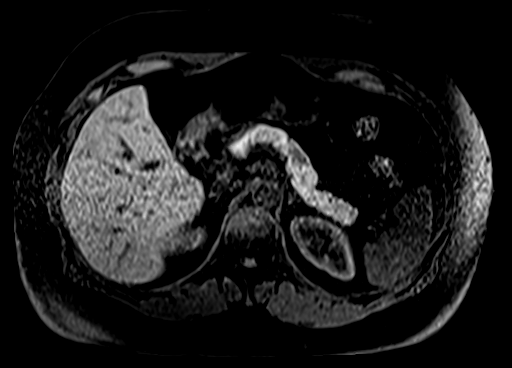
[im 88/88]
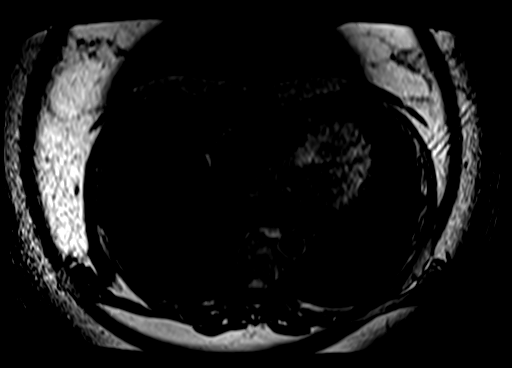

[Series 17: post 25 sec · axial · 2.5mm · 0.74mm/px · z∈[-77,+140]mm · 3 of 88 slices shown]
[im 1/88]
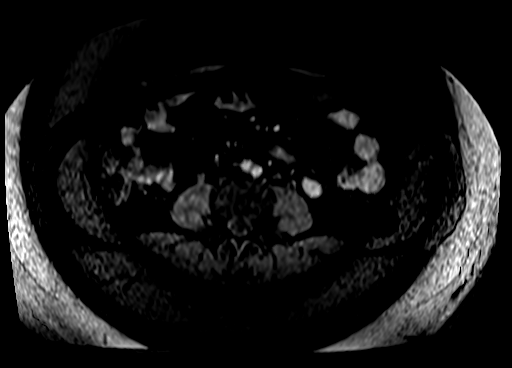
[im 44/88]
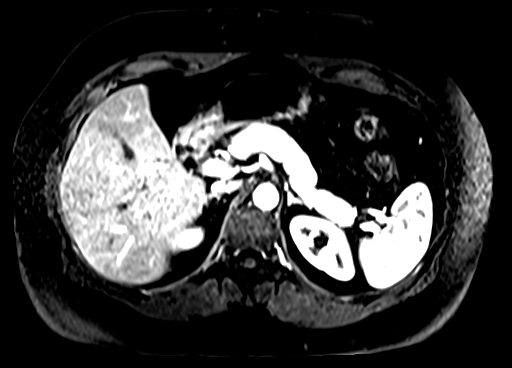
[im 88/88]
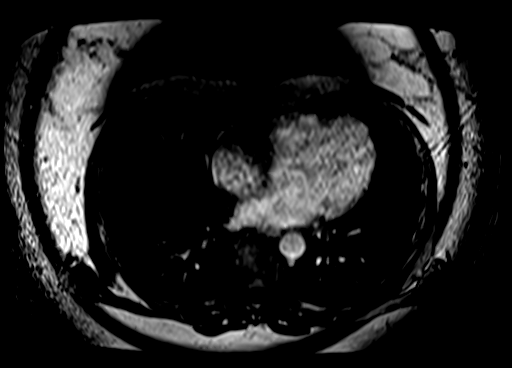

[Series 18: post 25 sec_sub · axial · 2.5mm · 0.74mm/px · z∈[-77,+140]mm · 3 of 88 slices shown]
[im 1/88]
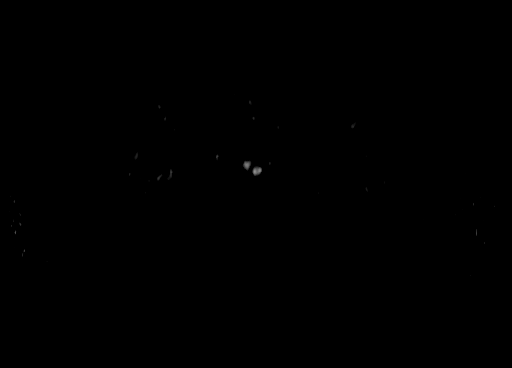
[im 44/88]
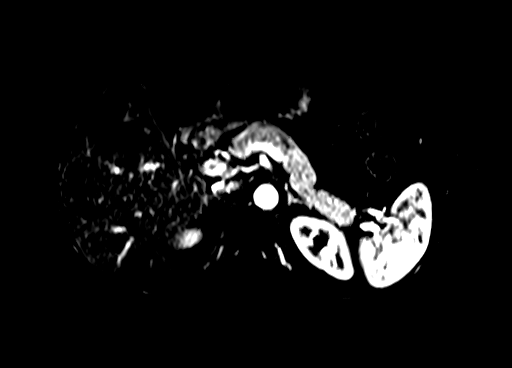
[im 88/88]
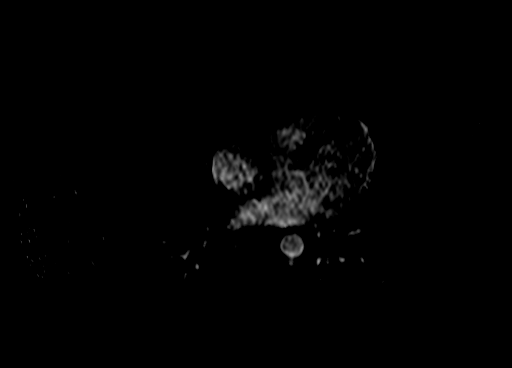

[Series 19: post 45 sec · axial · 2.5mm · 0.74mm/px · z∈[-77,+140]mm · 3 of 88 slices shown]
[im 1/88]
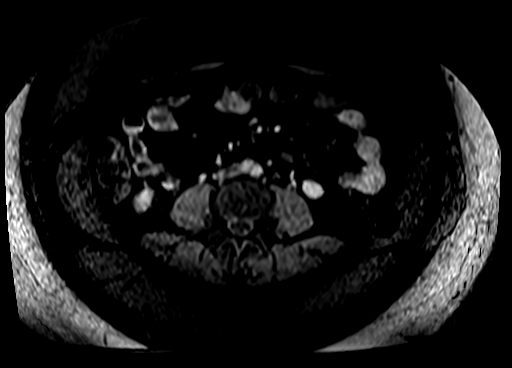
[im 44/88]
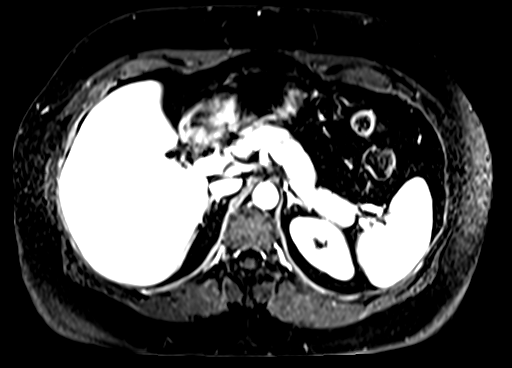
[im 88/88]
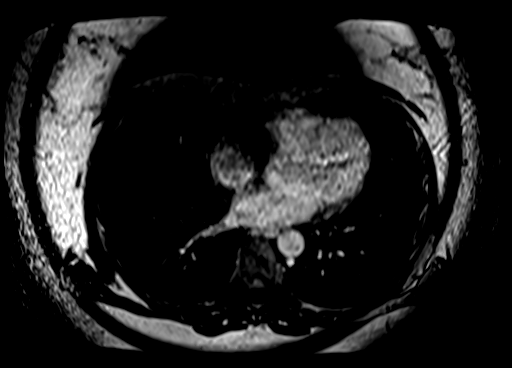

[Series 20: post 45 sec_sub · axial · 2.5mm · 0.74mm/px · z∈[-77,+30]mm · 2 of 88 slices shown]
[im 1/88]
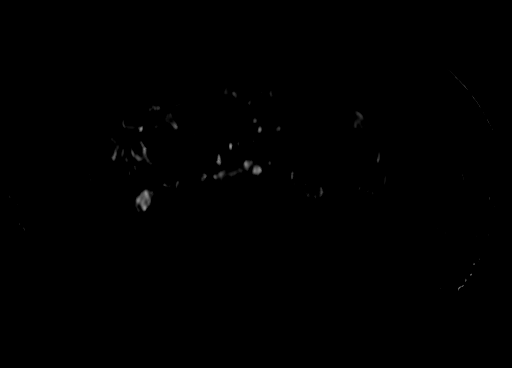
[im 44/88]
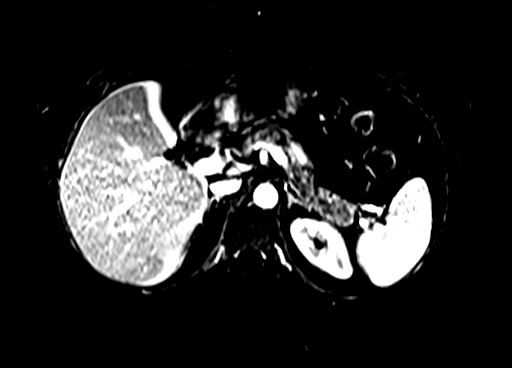

[31 of 48 positions shown; findings below may reference images not displayed]

FINDINGS: Lower chest: No acute abnormality at the lung bases.

Hepatobiliary: Normal liver size and configuration. No hepatic
steatosis on chemical shift imaging. No liver mass. Cholecystectomy.
There is mild dilatation of the intrahepatic bile ducts within the
left liver lobe, most prominent in the lateral segment left liver
lobe, unchanged since 05/28/2017 MRI. No dilatation of the right
liver lobe intrahepatic bile ducts. Common bile duct diameter 6 mm,
within normal post cholecystectomy limits. No choledocholithiasis.
No biliary strictures, masses or beading.

Pancreas: No pancreatic mass or duct dilation.  No pancreas divisum.

Spleen: Normal size. No mass.

Adrenals/Urinary Tract: Normal adrenals. No hydronephrosis. Sub 5 mm
simple renal cyst in the lateral upper left kidney. Otherwise normal
kidneys with no suspicious renal masses.

Stomach/Bowel: Normal non-distended stomach. Visualized small and
large bowel is normal caliber, with no bowel wall thickening.

Vascular/Lymphatic: Normal caliber abdominal aorta. Patent portal,
splenic, hepatic and renal veins. No pathologically enlarged lymph
nodes in the abdomen.

Other: No abdominal ascites or focal fluid collection.

Musculoskeletal: No aggressive appearing focal osseous lesions.
IMPRESSION: 1. Normal liver.  No hepatic steatosis.  No liver masses.
2. Common bile duct diameter 6 mm. Mild intrahepatic biliary ductal
dilatation in the left liver lobe. These findings are stable since
05/28/2017 MRI and are within normal post cholecystectomy limits. No
choledocholithiasis.

## 2020-06-03 ENCOUNTER — Other Ambulatory Visit: Payer: Self-pay | Admitting: Internal Medicine

## 2020-06-03 DIAGNOSIS — E785 Hyperlipidemia, unspecified: Secondary | ICD-10-CM

## 2020-07-09 ENCOUNTER — Ambulatory Visit
Admission: RE | Admit: 2020-07-09 | Discharge: 2020-07-09 | Disposition: A | Discharge status: Home / Self Care | Payer: No Typology Code available for payment source | Source: Ambulatory Visit | Attending: Internal Medicine | Admitting: Internal Medicine

## 2020-07-09 DIAGNOSIS — E785 Hyperlipidemia, unspecified: Secondary | ICD-10-CM

## 2020-09-17 ENCOUNTER — Other Ambulatory Visit: Payer: Self-pay | Admitting: Obstetrics and Gynecology

## 2020-09-17 DIAGNOSIS — Z1231 Encounter for screening mammogram for malignant neoplasm of breast: Secondary | ICD-10-CM

## 2020-10-28 ENCOUNTER — Other Ambulatory Visit: Payer: Self-pay

## 2020-10-28 ENCOUNTER — Ambulatory Visit
Admission: RE | Admit: 2020-10-28 | Discharge: 2020-10-28 | Disposition: A | Discharge status: Home / Self Care | Payer: No Typology Code available for payment source | Source: Ambulatory Visit | Attending: Obstetrics and Gynecology | Admitting: Obstetrics and Gynecology

## 2020-10-28 DIAGNOSIS — Z1231 Encounter for screening mammogram for malignant neoplasm of breast: Secondary | ICD-10-CM

## 2020-12-29 ENCOUNTER — Other Ambulatory Visit: Payer: Self-pay | Admitting: Otolaryngology

## 2020-12-29 DIAGNOSIS — H918X9 Other specified hearing loss, unspecified ear: Secondary | ICD-10-CM

## 2021-05-19 ENCOUNTER — Institutional Professional Consult (permissible substitution): Payer: No Typology Code available for payment source | Admitting: Internal Medicine

## 2021-09-30 ENCOUNTER — Other Ambulatory Visit: Payer: Self-pay | Admitting: Obstetrics and Gynecology

## 2021-09-30 DIAGNOSIS — Z1231 Encounter for screening mammogram for malignant neoplasm of breast: Secondary | ICD-10-CM

## 2021-10-29 ENCOUNTER — Ambulatory Visit
Admission: RE | Admit: 2021-10-29 | Discharge: 2021-10-29 | Disposition: A | Discharge status: Home / Self Care | Payer: 59 | Source: Ambulatory Visit | Attending: Obstetrics and Gynecology | Admitting: Obstetrics and Gynecology

## 2021-10-29 DIAGNOSIS — Z1231 Encounter for screening mammogram for malignant neoplasm of breast: Secondary | ICD-10-CM

## 2022-11-11 ENCOUNTER — Other Ambulatory Visit: Payer: Self-pay | Admitting: Internal Medicine

## 2022-11-11 DIAGNOSIS — Z1231 Encounter for screening mammogram for malignant neoplasm of breast: Secondary | ICD-10-CM

## 2022-12-01 ENCOUNTER — Ambulatory Visit: Payer: No Typology Code available for payment source | Admitting: Allergy

## 2022-12-03 ENCOUNTER — Ambulatory Visit
Admission: RE | Admit: 2022-12-03 | Discharge: 2022-12-03 | Disposition: A | Discharge status: Home / Self Care | Payer: 59 | Source: Ambulatory Visit | Attending: Internal Medicine | Admitting: Internal Medicine

## 2022-12-03 DIAGNOSIS — Z1231 Encounter for screening mammogram for malignant neoplasm of breast: Secondary | ICD-10-CM

## 2022-12-08 ENCOUNTER — Ambulatory Visit: Payer: 59 | Admitting: Allergy

## 2022-12-08 ENCOUNTER — Other Ambulatory Visit: Payer: Self-pay

## 2022-12-08 ENCOUNTER — Encounter: Payer: Self-pay | Admitting: Allergy

## 2022-12-08 VITALS — BP 112/68 | HR 83 | Temp 98.4°F | Resp 16 | Ht 62.21 in | Wt 188.6 lb

## 2022-12-08 DIAGNOSIS — J310 Chronic rhinitis: Secondary | ICD-10-CM

## 2022-12-08 DIAGNOSIS — L503 Dermatographic urticaria: Secondary | ICD-10-CM | POA: Diagnosis not present

## 2022-12-08 DIAGNOSIS — K149 Disease of tongue, unspecified: Secondary | ICD-10-CM

## 2022-12-08 DIAGNOSIS — B999 Unspecified infectious disease: Secondary | ICD-10-CM

## 2022-12-08 MED ORDER — RYALTRIS 665-25 MCG/ACT NA SUSP: 2.0000 | Freq: Every day | NASAL | 5 refills | Status: AC | PRN

## 2022-12-08 NOTE — Progress Notes (Signed)
New Patient Note  RE: KAMILI NEVILS MRN: [id number] DOB: 1960-08-18 Date of Office Visit: 12/08/2022   Primary care provider: Rodrigo Ran, MD  Chief Complaint: Allergies  History of present illness: Karina Alvarez is a 62 y.o. female presenting today for evaluation of allergies. She is a former patient of the practice is but states has not been seen in about 15 or so years.  Discussed the use of AI scribe software for clinical note transcription with the patient, who gave verbal consent to proceed.  The patient, with a history of recurrent ear and sinus infections, presents for evaluation of persistent allergy symptoms. She reports experiencing several ear infections annually, often in conjunction with sinus infections, necessitating antibiotic treatment approximately four times a year. The patient has had ear tubes inserted a few years ago as an adult due to the frequency of these infections.  The patient's allergy symptoms include a sensation of fullness in the ears, nasal drip, and voice changes, which have been ongoing for the past three months. She also reports frequent throat clearing. The patient has been on daily generic Zyrtec for several years, but it appears to be ineffective in managing these symptoms. She also uses Flonase daily for nasal congestion.  In addition to these symptoms, the patient has been experiencing red, crusty spots on her skin, which have been suggested to be eczema  however these areas do not itch. She also reports a burning sensation in her tongue, particularly when consuming wine or fruit, which has been a daily issue.  She has discussed this issue with her dentist.  The patient has a history of dermatographia, which was diagnosed approximately 15 years ago. She has been on montelukast in the past but discontinued due to side effects. She has not been on allergy shots.   The patient has received a pneumonia vaccine a couple of years ago and reports having  pneumonia shortly after the vaccination diagnosed on imaging. She also receives annual flu vaccines.  Review of systems: 10pt ROS negative unless noted above in HPI  All other systems negative unless noted above in HPI  Past medical history: Past Medical History:  Diagnosis Date   Eczema    GERD (gastroesophageal reflux disease)    Recurrent upper respiratory infection (URI)     Past surgical history: Past Surgical History:  Procedure Laterality Date   ABDOMINAL HYSTERECTOMY     CHOLECYSTECTOMY     TYMPANOSTOMY TUBE PLACEMENT      Family history:  Family History  Problem Relation Age of Onset   Allergic rhinitis Mother    Allergic rhinitis Sister    Breast cancer Cousin    Breast cancer Cousin     Social history: Lives in a home without carpeting with electric and heat pump heating with heat pump cooling.  Cats in the home.  There is no concern for water damage, mildew or roaches in the home.  She is a former Print production planner.  She denies a smoking history.   Medication List: Current Outpatient Medications  Medication Sig Dispense Refill   ALPRAZolam (XANAX) 0.25 MG tablet Take 0.25 mg by mouth daily as needed.     Cholecalciferol (VITAMIN D3 PO) Take 1 tablet by mouth daily.     fluticasone (FLONASE) 50 MCG/ACT nasal spray Place 1 spray into both nostrils daily.     linaclotide (LINZESS) 72 MCG capsule Take 72 mcg by mouth daily before breakfast.     pantoprazole (PROTONIX) 40 MG  tablet Take 40 mg by mouth daily.     Ascorbic Acid (VITAMIN C) 1000 MG tablet Take 1,000 mg by mouth daily. (Patient not taking: Reported on 12/08/2022)     Chlorpheniramine Maleate (ALLERGY PO) Take 1 tablet by mouth. allertec (Patient not taking: Reported on 12/08/2022)     montelukast (SINGULAIR) 10 MG tablet Take 10 mg by mouth daily. (Patient not taking: Reported on 12/08/2022)     Multiple Vitamins-Minerals (ZINC PO) Take by mouth. (Patient not taking: Reported on 12/08/2022)     No  current facility-administered medications for this visit.    Known medication allergies: Allergies  Allergen Reactions   Iohexol Other (See Comments)     Code: SOB, Desc: Pt developed facial swelling and difficulty breathing the several hours after receiving CT contrast and was hospitalized., Onset Date: [id number]    Codeine Nausea And Vomiting     Physical examination: Blood pressure 112/68, pulse 83, temperature 98.4 F (36.9 C), temperature source Temporal, resp. rate 16, height 5' 2.21" (1.58 m), weight 188 lb 9.6 oz (85.5 kg), SpO2 94%.  General: Alert, interactive, in no acute distress. HEENT: PERRLA, TMs pearly gray, turbinates non-edematous without discharge, post-pharynx non erythematous. Neck: Supple without lymphadenopathy. Lungs: Clear to auscultation without wheezing, rhonchi or rales. {no increased work of breathing. CV: Normal S1, S2 without murmurs. Abdomen: Nondistended, nontender. Skin: Warm and dry, without lesions or rashes. Extremities:  No clubbing, cyanosis or edema. Neuro:   Grossly intact.  Diagnositics/Labs: Stroke of the skin with a tongue depressor to the forearm revealed erythema immediately with mildly raised area  Assessment and plan:   Recurrent Sinusitis and Ear infections Frequent ear and sinus infections, approximately four times a year, requiring antibiotics for resolution. Currently, no signs of active infection. Noted concern for potential immune system dysfunction given the frequency of infections in adulthood. -Order immune screen to evaluate immune system function and response to vaccines.  Allergic Rhinitis Chronic symptoms of nasal congestion, postnasal drip, and voice changes. Current treatment with generic Zyrtec and Flonase not providing adequate symptom control. -Change nasal spray to Ryaltris 2 sprays each nostril twice a day as needed.  This is a   combined spray with antihistamine and steroid ingredients. With using nasal sprays  point tip of bottle toward eye on same side nostril and lean head slightly forward for best technique.   -Stop Zyrtec and change to either Allegra 180mg  or Xyzal 5mg  daily -Order environmental allergy testing via blood work due to history of dermatographia.  Dermatographia History of skin welting with pressure, confirmed during visit. -antihistamine above helps this response  Tongue irritation -may be oral allergy syndrome Reports of burning tongue sensation with consumption of wine and fruit. -Will determine if you have pollen allergy which could cause these symptoms.  The oral allergy syndrome (OAS) or pollen-food allergy syndrome (PFAS) is a relatively common form of food allergy, particularly in adults. It typically occurs in people who have pollen allergies when the immune system "sees" proteins on the food that look like proteins on the pollen. This results in the allergy antibody (IgE) binding to the food instead of the pollen. Patients typically report itching and/or mild swelling of the mouth and throat immediately following ingestion of certain uncooked fruits (including nuts) or raw vegetables. Only a very small number of affected individuals experience systemic allergic reactions, such as anaphylaxis which occurs with true food allergies.    Follow-up in 4-6 months or sooner if needed  I appreciate the  opportunity to take part in Antonella's care. Please do not hesitate to contact me with questions.  Sincerely,   Margo Aye, MD Allergy/Immunology [facility]

## 2022-12-08 NOTE — Patient Instructions (Addendum)
Recurrent Sinusitis and Ear infections Frequent ear and sinus infections, approximately four times a year, requiring antibiotics for resolution. Currently, no signs of active infection. Noted concern for potential immune system dysfunction given the frequency of infections in adulthood. -Order immune screen to evaluate immune system function and response to vaccines.  Allergic Rhinitis Chronic symptoms of nasal congestion, postnasal drip, and voice changes. Current treatment with generic Zyrtec and Flonase not providing adequate symptom control. -Change nasal spray to Ryaltris 2 sprays each nostril twice a day as needed.  This is a   combined spray with antihistamine and steroid ingredients. With using nasal sprays point tip of bottle toward eye on same side nostril and lean head slightly forward for best technique.   -Stop Zyrtec and change to either Allegra 180mg  or Xyzal 5mg  daily -Order environmental allergy testing via blood work due to history of dermatographia.  Dermatographia History of skin welting with pressure, confirmed during visit. -antihistamine above helps this response  Possible Oral Allergy Syndrome Reports of burning tongue sensation with consumption of wine and fruit. -Will determine if you have pollen allergy which could cause these symptoms.  The oral allergy syndrome (OAS) or pollen-food allergy syndrome (PFAS) is a relatively common form of food allergy, particularly in adults. It typically occurs in people who have pollen allergies when the immune system "sees" proteins on the food that look like proteins on the pollen. This results in the allergy antibody (IgE) binding to the food instead of the pollen. Patients typically report itching and/or mild swelling of the mouth and throat immediately following ingestion of certain uncooked fruits (including nuts) or raw vegetables. Only a very small number of affected individuals experience systemic allergic reactions, such as  anaphylaxis which occurs with true food allergies.    Follow-up in 4-6 months or sooner if needed

## 2022-12-14 LAB — ALLERGENS W/TOTAL IGE AREA 2
Alternaria Alternata IgE: <0.1 kU/L
Aspergillus Fumigatus IgE: <0.1 kU/L
Bermuda Grass IgE: <0.1 kU/L
Cat Dander IgE: <0.1 kU/L
Cedar, Mountain IgE: <0.1 kU/L
Cladosporium Herbarum IgE: <0.1 kU/L
Cockroach, German IgE: <0.1 kU/L
Common Silver Birch IgE: <0.1 kU/L
Cottonwood IgE: <0.1 kU/L
D Farinae IgE: <0.1 kU/L
D Pteronyssinus IgE: <0.1 kU/L
Dog Dander IgE: <0.1 kU/L
Elm, American IgE: <0.1 kU/L
IgE (Immunoglobulin E), Serum: 2 [IU]/mL — ABNORMAL LOW (ref 6–495)
Johnson Grass IgE: <0.1 kU/L
Maple/Box Elder IgE: <0.1 kU/L
Mouse Urine IgE: <0.1 kU/L
Oak, White IgE: <0.1 kU/L
Pecan, Hickory IgE: <0.1 kU/L
Penicillium Chrysogen IgE: <0.1 kU/L
Pigweed, Rough IgE: <0.1 kU/L
Ragweed, Short IgE: <0.1 kU/L
Sheep Sorrel IgE Qn: <0.1 kU/L
Timothy Grass IgE: <0.1 kU/L
White Mulberry IgE: <0.1 kU/L

## 2022-12-14 LAB — CBC WITH DIFFERENTIAL/PLATELET
Basophils Absolute: 0 10*3/uL (ref 0.0–0.2)
Basos: 1 %
EOS (ABSOLUTE): 0.2 10*3/uL (ref 0.0–0.4)
Eos: 2 %
Hematocrit: 41.8 % (ref 34.0–46.6)
Hemoglobin: 14.2 g/dL (ref 11.1–15.9)
Immature Grans (Abs): 0 10*3/uL (ref 0.0–0.1)
Immature Granulocytes: 1 %
Lymphocytes Absolute: 2.2 10*3/uL (ref 0.7–3.1)
Lymphs: 25 %
MCH: 31.1 pg (ref 26.6–33.0)
MCHC: 34 g/dL (ref 31.5–35.7)
MCV: 92 fL (ref 79–97)
Monocytes Absolute: 0.6 10*3/uL (ref 0.1–0.9)
Monocytes: 7 %
Neutrophils Absolute: 5.6 10*3/uL (ref 1.4–7.0)
Neutrophils: 64 %
Platelets: 263 10*3/uL (ref 150–450)
RBC: 4.57 x10E6/uL (ref 3.77–5.28)
RDW: 12.3 % (ref 11.7–15.4)
WBC: 8.5 10*3/uL (ref 3.4–10.8)

## 2022-12-14 LAB — STREP PNEUMONIAE 23 SEROTYPES IGG
Pneumo Ab Type 1*: 2.7 ug/mL (ref >1.3)
Pneumo Ab Type 12 (12F)*: 4.6 ug/mL (ref >1.3)
Pneumo Ab Type 14*: 10 ug/mL (ref >1.3)
Pneumo Ab Type 17 (17F)*: 2.6 ug/mL (ref >1.3)
Pneumo Ab Type 19 (19F)*: 0.8 ug/mL — ABNORMAL LOW (ref >1.3)
Pneumo Ab Type 2*: 6.2 ug/mL (ref >1.3)
Pneumo Ab Type 20*: 17.1 ug/mL (ref >1.3)
Pneumo Ab Type 22 (22F)*: 2.2 ug/mL (ref >1.3)
Pneumo Ab Type 23 (23F)*: 18.2 ug/mL (ref >1.3)
Pneumo Ab Type 26 (6B)*: 0.2 ug/mL — ABNORMAL LOW (ref >1.3)
Pneumo Ab Type 3*: 0.3 ug/mL — ABNORMAL LOW (ref >1.3)
Pneumo Ab Type 34 (10A)*: 32.9 ug/mL (ref >1.3)
Pneumo Ab Type 4*: 0.4 ug/mL — ABNORMAL LOW (ref >1.3)
Pneumo Ab Type 43 (11A)*: 6.5 ug/mL (ref >1.3)
Pneumo Ab Type 5*: 2.2 ug/mL (ref >1.3)
Pneumo Ab Type 51 (7F)*: >19.6 ug/mL (ref >1.3)
Pneumo Ab Type 54 (15B)*: 1.7 ug/mL (ref >1.3)
Pneumo Ab Type 56 (18C)*: 5.5 ug/mL (ref >1.3)
Pneumo Ab Type 57 (19A)*: 3.2 ug/mL (ref >1.3)
Pneumo Ab Type 68 (9V)*: 4.1 ug/mL (ref >1.3)
Pneumo Ab Type 70 (33F)*: 13.3 ug/mL (ref >1.3)
Pneumo Ab Type 8*: 35.2 ug/mL (ref >1.3)
Pneumo Ab Type 9 (9N)*: 3.8 ug/mL (ref >1.3)

## 2022-12-14 LAB — IGG, IGA, IGM
IgA/Immunoglobulin A, Serum: 210 mg/dL (ref 87–352)
IgG (Immunoglobin G), Serum: 698 mg/dL (ref 586–1602)
IgM (Immunoglobulin M), Srm: 125 mg/dL (ref 26–217)

## 2022-12-14 LAB — COMPLEMENT, TOTAL: Compl, Total (CH50): >60 U/mL (ref >41)

## 2022-12-14 LAB — DIPHTHERIA / TETANUS ANTIBODY PANEL
Diphtheria Ab: 0.22 [IU]/mL (ref <0.10)
Tetanus Ab, IgG: 0.31 [IU]/mL (ref <0.10)

## 2023-01-14 ENCOUNTER — Telehealth: Payer: Self-pay | Admitting: Allergy

## 2023-01-14 NOTE — Telephone Encounter (Signed)
Patient called and would like a call back to go over her lab results. Patients call back number is [phone]

## 2023-01-14 NOTE — Telephone Encounter (Signed)
Called patient and let her know of her lab results. Patient stated that she understood and did not have any questions.

## 2023-05-17 ENCOUNTER — Encounter: Payer: Self-pay | Admitting: Radiology

## 2023-05-17 ENCOUNTER — Encounter: Payer: Self-pay | Admitting: Allergy

## 2023-05-17 ENCOUNTER — Other Ambulatory Visit: Payer: Self-pay | Admitting: Internal Medicine

## 2023-05-17 DIAGNOSIS — R109 Unspecified abdominal pain: Secondary | ICD-10-CM

## 2023-05-31 ENCOUNTER — Other Ambulatory Visit

## 2023-09-28 ENCOUNTER — Ambulatory Visit: Admitting: Podiatry

## 2023-10-05 ENCOUNTER — Ambulatory Visit: Admitting: Podiatry

## 2023-10-10 ENCOUNTER — Encounter: Payer: Self-pay | Admitting: Podiatry

## 2023-10-10 ENCOUNTER — Ambulatory Visit (INDEPENDENT_AMBULATORY_CARE_PROVIDER_SITE_OTHER)

## 2023-10-10 ENCOUNTER — Ambulatory Visit (INDEPENDENT_AMBULATORY_CARE_PROVIDER_SITE_OTHER): Admitting: Podiatry

## 2023-10-10 VITALS — Ht 62.5 in | Wt 188.0 lb

## 2023-10-10 DIAGNOSIS — M7752 Other enthesopathy of left foot: Secondary | ICD-10-CM | POA: Diagnosis not present

## 2023-10-10 DIAGNOSIS — M722 Plantar fascial fibromatosis: Secondary | ICD-10-CM | POA: Diagnosis not present

## 2023-10-10 DIAGNOSIS — M7751 Other enthesopathy of right foot: Secondary | ICD-10-CM | POA: Diagnosis not present

## 2023-10-10 DIAGNOSIS — M778 Other enthesopathies, not elsewhere classified: Secondary | ICD-10-CM | POA: Diagnosis not present

## 2023-10-10 DIAGNOSIS — D3610 Benign neoplasm of peripheral nerves and autonomic nervous system, unspecified: Secondary | ICD-10-CM

## 2023-10-10 MED ORDER — TRIAMCINOLONE ACETONIDE 10 MG/ML IJ SUSP
10.0000 mg | Freq: Once | INTRAMUSCULAR | Status: AC
  Administered 2023-10-10: 10 mg via INTRA_ARTICULAR

## 2023-10-12 NOTE — Progress Notes (Signed)
 Subjective:   Patient ID: Karina Alvarez, female   DOB: 63 y.o.   MRN: [id number]   HPI Patient presents stating she has a lot of pain in the heels bilateral which is chronic and present over the years also has been having pain on the side of her big toe left it is difficult for her to identify.  States has been going on for years as far as the heels go just has gotten worse in the left big toe for several months.  Patient does not smoke likes to be active   Review of Systems  All other systems reviewed and are negative.       Objective:  Physical Exam Vitals and nursing note reviewed.  Constitutional:      Appearance: She is well-developed.  Pulmonary:     Effort: Pulmonary effort is normal.  Musculoskeletal:        General: Normal range of motion.  Skin:    General: Skin is warm.  Neurological:     Mental Status: She is alert.     Neurovascular status was found to be intact muscle strength was found to be adequate range of motion found to be adequate.  Patient is noted to have inflammation and pain of the plantar heel region bilaterally with fluid buildup and is noted to have inflammation around the side of the big toe left possibly the nailbed or possibly the inner phalangeal joint of the mild to moderate nature.  Good digital perfusion well-oriented x 3     Assessment:  Chronic plantar fasciitis bilateral along with inflammation of the left hallux which may be nail related or related to joint inflammation     Plan:  H&P all conditions reviewed and discussed fascial inflammation.  At this point I went ahead I did sterile prep I injected the plantar fascia at insertion 3 mg Kenalog  5 mg Xylocaine discussed other things we could do if symptoms do not alleviate with this and I applied cushioning to the left big toe to offload weight.  May require other treatments we will see back as symptoms indicate  X-rays indicate small spur formation plantar heel right over left no indication  stress fracture or arthritis

## 2023-11-07 ENCOUNTER — Other Ambulatory Visit: Payer: Self-pay | Admitting: Obstetrics and Gynecology

## 2023-11-07 DIAGNOSIS — Z1231 Encounter for screening mammogram for malignant neoplasm of breast: Secondary | ICD-10-CM

## 2023-11-24 ENCOUNTER — Ambulatory Visit: Admitting: Internal Medicine

## 2023-11-24 ENCOUNTER — Ambulatory Visit

## 2023-11-24 ENCOUNTER — Encounter: Payer: Self-pay | Admitting: Internal Medicine

## 2023-11-24 VITALS — BP 111/70 | HR 69 | Temp 98.1°F | Ht 62.0 in | Wt 188.0 lb

## 2023-11-24 DIAGNOSIS — R058 Other specified cough: Secondary | ICD-10-CM

## 2023-11-24 DIAGNOSIS — F17210 Nicotine dependence, cigarettes, uncomplicated: Secondary | ICD-10-CM

## 2023-11-24 LAB — POCT EXHALED NITRIC OXIDE: FeNO level (ppb): 19

## 2023-11-24 MED ORDER — METHYLPREDNISOLONE ACETATE 80 MG/ML IJ SUSP
120.0000 mg | Freq: Once | INTRAMUSCULAR | Status: AC
  Administered 2023-11-24: 120 mg via INTRAMUSCULAR

## 2023-11-24 MED ORDER — AZITHROMYCIN 250 MG PO TABS: ORAL_TABLET | ORAL | 0 refills | Status: AC

## 2023-11-24 MED ORDER — BENZONATATE 200 MG PO CAPS: 200.0000 mg | ORAL_CAPSULE | Freq: Three times a day (TID) | ORAL | 2 refills | Status: AC | PRN

## 2023-11-24 NOTE — Patient Instructions (Addendum)
 Continue pantoprazole Take 30- 60 min before your first and last meals of the day   GERD (REFLUX)  is an extremely common cause of respiratory symptoms just like yours , many times with no obvious heartburn at all.    It can be treated with medication, but also with lifestyle changes including elevation of the head of your bed (ideally with 6 -8inch blocks under the headboard of your bed),  Smoking cessation, avoidance of late meals, excessive alcohol, and avoid fatty foods, chocolate, peppermint, colas, red wine, and acidic juices such as orange juice.  NO MINT OR MENTHOL PRODUCTS SO NO COUGH DROPS  USE SUGARLESS CANDY INSTEAD (Jolley ranchers or Stover's or Life Savers) or even ice chips will also do - the key is to swallow to prevent all throat clearing. NO OIL BASED VITAMINS - use powdered substitutes.  Avoid fish oil when coughing.   Depomedrol 120 mg IM   Augmentin 875 mg take one pill twice daily  X 10 days - take at breakfast and supper with large glass of water.  It would help reduce the usual side effects (diarrhea and yeast infections) if you ate cultured yogurt at lunch.    Please remember to go to the  x-ray department  for your tests - we will call you with the results when they are available     Take delsym two tsp every 12 hours and supplement if needed with  tessalon 200  to suppress the urge to cough. Swallowing water and/or using ice chips/non mint and menthol containing candies (such as lifesavers or sugarless jolly ranchers) are also effective.  You should rest your voice and avoid activities that you know make you cough.  Once you have eliminated the cough for 3 straight days tessalon 200   then the delsym as tolerated.     Call me in couple of weeks if you want see Dr Brien or let me try you on gabapentin starting at 100 mg per day

## 2023-11-24 NOTE — Progress Notes (Unsigned)
 Karina Alvarez, female    DOB: 1960-05-07   MRN: [id number]   Brief patient profile:  63 yowf  minimal smoking  self - referred to pulmonary clinic 11/24/2023   perfectly healhy until 1991 p last pregnancy onset rhinitis/ sinsusits/vertigo eval >  Karina Alvarez/ Karina Alvarez / Karina Alvarez and much worse cough p pna p sinus infection in 2021 rx as outpt by Karina Alvarez office with cxr cleared by March 2021 per pt but no improvement cough x transiently some better p prednisone.     Pt not previously seen by Karina Alvarez service.    History of Present Illness  11/24/2023  Pulmonary/ 1st office eval/Karina Alvarez  Chief Complaint  Patient presents with   Consult   Cough    Constant dry cough. Started 4 year ago after history of pneumonia.  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    Also denies any obvious fluctuation of symptoms with weather or environmental changes or other aggravating or alleviating factors except as outlined above   No unusual exposure hx or h/o childhood pna/ asthma or knowledge of premature birth.  Current Allergies, Complete Past Medical History, Past Surgical History, Family History, and Social History were reviewed in Karina Alvarez record.  ROS  The following are not active complaints unless bolded Hoarseness, sore throat, dysphagia/globus , dental problems, itching, sneezing,  nasal congestion or discharge of excess mucus or purulent secretions, ear ache,   fever, chills, sweats, unintended wt loss or wt gain, classically pleuritic or exertional cp,  orthopnea pnd or  arm/hand swelling  or leg swelling, presyncope, palpitations, abdominal pain, anorexia, nausea, vomiting, diarrhea  or change in bowel habits or change in bladder habits, change in stools or change in urine, dysuria, hematuria,  rash, arthralgias, visual complaints, headache, numbness, weakness or ataxia or problems with walking or coordination,  change in mood or  memory.             Outpatient Medications Prior to Visit  Medication Sig Dispense Refill   ALPRAZolam (XANAX) 0.25 MG tablet Take 0.25 mg by mouth daily as needed.     cetirizine (ZYRTEC ALLERGY) 10 MG tablet      fluticasone (FLONASE) 50 MCG/ACT nasal spray Place 1 spray into both nostrils daily.     linaclotide (LINZESS) 72 MCG capsule Take 72 mcg by mouth daily before breakfast.     Olopatadine-Mometasone (RYALTRIS ) 665-25 MCG/ACT SUSP Place 2 sprays into the nose daily as needed. 29 g 5   pantoprazole (PROTONIX) 40 MG tablet Take 40 mg by mouth daily.     Cholecalciferol (VITAMIN D3 PO) Take 1 tablet by mouth daily. (Patient not taking: Reported on 11/24/2023)     No facility-administered medications prior to visit.    Past Medical History:  Diagnosis Date   Eczema    GERD (gastroesophageal reflux disease)    Recurrent upper respiratory infection (URI)       Objective:     BP 111/70   Pulse 69   Temp  98.1 F (36.7 C) (Oral)   Ht 5' 2 (1.575 m)   Wt 188 lb (85.3 kg)   SpO2 94%   BMI 34.39 kg/m   SpO2: 94 % RA  Pleasant amb mod obese (by BMI) wf with classic voice fatigue/ throat clearing   HEENT : Oropharynx  pristine      Nasal turbinates mod edema/ no polyps  Ear exam nl/ no cough triggered with otiscope    NECK :  without  apparent JVD/ palpable Nodes/TM    LUNGS: no acc muscle use,  Nl contour chest which is clear to A and P bilaterally without cough on insp or exp maneuvers   CV:  RRR  no s3 or murmur or increase in P2, and no edema   ABD:  soft and nontender   MS:  Gait nl   ext warm  without deformities Or obvious joint restrictions  calf tenderness, cyanosis or clubbing    SKIN: warm and dry without lesions    NEURO:  alert, approp, nl sensorium with  no motor or cerebellar deficits apparent.    CXR PA and Lateral:   11/24/2023 :    I personally reviewed images and impression is as follows:     Minimal atx projecting over the RML or lingula on lateral view.     Assessment     Assessment & Plan Upper airway cough syndrome 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  - 11/24/2023   FENO  19 (relatively low) -  11/24/2023 max gerd rx with tessalon 200/ deslym for suppression and single depomedrol 120 mg IM with next step gabapentin trial vs refer to Karina Alvarez at [facility]  Upper airway cough syndrome (previously labeled PNDS),  is so named because it's frequently impossible to sort out how much is  CR/sinusitis with freq throat clearing (which can be related to primary GERD)   vs  causing  secondary ( extra esophageal)  GERD from wide swings in gastric pressure that occur with throat clearing, often  promoting self use of mint and menthol lozenges that reduce the lower esophageal sphincter tone and exacerbate the problem further in a cyclical fashion.   These are the same pts (now being labeled as having irritable larynx syndrome by some cough centers) who not infrequently have a history of having failed to tolerate ace inhibitors,  dry powder inhalers(or in her case any inhaler at all) or biphosphonates or report having atypical/extraesophageal reflux symptoms from LPR (globus, throat clearing)  that don't respond to standard doses of PPI  and are easily confused as having aecopd or asthma flares by even experienced allergists/ pulmonologists (myself included).   Of the three most common causes of  Sub-acute / recurrent or chronic cough, only one (GERD)  can actually contribute to/ trigger  the other two (asthma and post nasal drip syndrome)   and perpetuate the cylce of cough.  While not intuitively obvious, many patients with chronic low grade reflux do not cough until there is a primary insult that disturbs the protective epithelial barrier and exposes sensitive nerve endings.   This is typically viral but can due to PNDS and  either may apply here.   The point is that once this occurs, it is difficult to eliminate the cycle  using anything but a maximally effective acid suppression regimen at least in the short run, accompanied by an appropriate diet to address non acid GERD and control / eliminate  the cough itself for at least 3-5 days with delsym/ tessalon 200 and also depomedrol 120 mg IM   in case of component of Th-2 driven upper or lower airways inflammation (if cough responds short term only to relapse before return while will on full rx for uacs (as above), then that would point to allergic rhinitis/ asthma or eos bronchitis as alternative dx)     If short term cough suppression not effective then next step is to titrate up gabapentiin to max of 1200 mg or refer to ENT [facility]/ Karina Garnette Alvarez.   Discussed in detail all the  indications, usual  risks and alternatives  relative to the benefits with patient who agrees to proceed with Rx as outlined.             Each maintenance medication was reviewed in detail including emphasizing most importantly the difference between maintenance and prns and under what circumstances the prns are to be triggered using an action plan format where appropriate.  Total time for H and P, chart review, counseling, reviewing FENO  and generating customized AVS unique to this office visit / same day charting = 62 min new pt eval for refractory respiratory  symptoms of uncertain etiology          AVS  Patient Instructions  Continue pantoprazole Take 30- 60 min before your first and last meals of the day   GERD (REFLUX)  is an extremely common cause of respiratory symptoms just like yours , many  times with no obvious heartburn at all.    It can be treated with medication, but also with lifestyle changes including elevation of the head of your bed (ideally with 6 -8inch blocks under the headboard of your bed),  Smoking cessation, avoidance of late meals, excessive alcohol, and avoid fatty foods, chocolate, peppermint, colas, red wine, and acidic juices such as orange juice.  NO MINT OR MENTHOL PRODUCTS SO NO COUGH DROPS  USE SUGARLESS CANDY INSTEAD (Jolley ranchers or Stover's or Life Savers) or even ice chips will also do - the key is to swallow to prevent all throat clearing. NO OIL BASED VITAMINS - use powdered substitutes.  Avoid fish oil when coughing.   Depomedrol 120 mg IM   Augmentin 875 mg take one pill twice daily  X 10 days - take at breakfast and supper with large glass of water.  It would help reduce the usual side effects (diarrhea and yeast infections) if you ate cultured yogurt at lunch.    Please remember to go to the  x-ray department  for your tests - we will call you with the results when they are available     Take delsym two tsp every 12 hours and supplement if needed with  tessalon 200  to suppress the urge to cough. Swallowing water and/or using ice chips/non mint and menthol containing candies (such as lifesavers or sugarless jolly ranchers) are also effective.  You should rest your voice and avoid activities that you know make you cough.  Once you have eliminated the cough for 3 straight days tessalon 200   then the delsym as tolerated.     Call me in couple of weeks if you want see Karina Alvarez or let me try you on gabapentin starting at 100 mg per day     Ozell America, MD 11/25/2023

## 2023-11-25 NOTE — Assessment & Plan Note (Addendum)
 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  - 11/24/2023   FENO  19 (relatively low) -  11/24/2023 max gerd rx with tessalon 200/ deslym for suppression and single depomedrol 120 mg IM with next step gabapentin trial vs refer to GORMAN Silvan at [facility]  Upper airway cough syndrome (previously labeled PNDS),  is so named because it's frequently impossible to sort out how much is  CR/sinusitis with freq throat clearing (which can be related to primary GERD)   vs  causing  secondary ( extra esophageal)  GERD from wide swings in gastric pressure that occur with throat clearing, often  promoting self use of mint and menthol lozenges that reduce the lower esophageal sphincter tone and exacerbate the problem further in a cyclical fashion.   These are the same pts (now being labeled as having irritable larynx syndrome by some cough centers) who not infrequently have a history of having failed to tolerate ace inhibitors,  dry powder inhalers(or in her case any inhaler at all) or biphosphonates or report having atypical/extraesophageal reflux symptoms from LPR (globus, throat clearing)  that don't respond to standard doses of PPI  and are easily confused as having aecopd or asthma flares by even experienced allergists/ pulmonologists (myself included).   Of the three most common causes of  Sub-acute / recurrent or chronic cough, only one (GERD)  can actually contribute to/ trigger  the other two (asthma and post nasal drip syndrome)  and perpetuate the cylce of cough.  While not intuitively obvious, many patients with chronic low grade reflux do not cough until there is a primary insult that disturbs the protective epithelial barrier and exposes sensitive nerve endings.   This is typically viral but can due to PNDS and  either may apply here.   The point is that once this occurs, it is difficult to eliminate the cycle  using anything but a maximally effective acid suppression regimen at  least in the short run, accompanied by an appropriate diet to address non acid GERD and control / eliminate the cough itself for at least 3-5 days with delsym/ tessalon 200 and also depomedrol 120 mg IM   in case of component of Th-2 driven upper or lower airways inflammation (if cough responds short term only to relapse before return while will on full rx for uacs (as above), then that would point to allergic rhinitis/ asthma or eos bronchitis as alternative dx)     If short term cough suppression not effective then next step is to titrate up gabapentiin to max of 1200 mg or refer to ENT [facility]/ Dr Garnette Silvan.   Discussed in detail all the  indications, usual  risks and alternatives  relative to the benefits with patient who agrees to proceed with Rx as outlined.             Each maintenance medication was reviewed in detail including emphasizing most importantly the difference between maintenance and prns and under what circumstances the prns are to be triggered using an action plan format where appropriate.  Total time for H and P, chart review, counseling, reviewing FENO  and generating customized AVS unique to this office visit / same day charting = 62 min new pt eval for refractory respiratory  symptoms of uncertain etiology

## 2023-11-27 ENCOUNTER — Ambulatory Visit: Payer: Self-pay | Admitting: Internal Medicine

## 2023-12-06 ENCOUNTER — Ambulatory Visit

## 2023-12-07 ENCOUNTER — Ambulatory Visit
Admission: RE | Admit: 2023-12-07 | Discharge: 2023-12-07 | Disposition: A | Discharge status: Home / Self Care | Source: Ambulatory Visit | Attending: Obstetrics and Gynecology | Admitting: Obstetrics and Gynecology

## 2023-12-07 DIAGNOSIS — Z1231 Encounter for screening mammogram for malignant neoplasm of breast: Secondary | ICD-10-CM

## 2024-02-24 ENCOUNTER — Encounter (INDEPENDENT_AMBULATORY_CARE_PROVIDER_SITE_OTHER): Payer: Self-pay

## 2024-05-02 ENCOUNTER — Institutional Professional Consult (permissible substitution) (INDEPENDENT_AMBULATORY_CARE_PROVIDER_SITE_OTHER): Admitting: Otolaryngology
